# Patient Record
Sex: Female | Born: 1955 | Race: White | Hispanic: No | Marital: Married | Smoking: Never smoker
Health system: Southern US, Community
[De-identification: ages and names within clinical notes are randomized; demographics above are authoritative.]

## PROBLEM LIST (undated history)

## (undated) DIAGNOSIS — F32A Depression, unspecified: Secondary | ICD-10-CM

## (undated) DIAGNOSIS — K769 Liver disease, unspecified: Secondary | ICD-10-CM

## (undated) DIAGNOSIS — K279 Peptic ulcer, site unspecified, unspecified as acute or chronic, without hemorrhage or perforation: Secondary | ICD-10-CM

## (undated) DIAGNOSIS — E079 Disorder of thyroid, unspecified: Secondary | ICD-10-CM

## (undated) DIAGNOSIS — K319 Disease of stomach and duodenum, unspecified: Secondary | ICD-10-CM

## (undated) DIAGNOSIS — K5790 Diverticulosis of intestine, part unspecified, without perforation or abscess without bleeding: Secondary | ICD-10-CM

## (undated) DIAGNOSIS — Z1371 Encounter for nonprocreative screening for genetic disease carrier status: Secondary | ICD-10-CM

## (undated) DIAGNOSIS — T8859XA Other complications of anesthesia, initial encounter: Secondary | ICD-10-CM

## (undated) DIAGNOSIS — F329 Major depressive disorder, single episode, unspecified: Secondary | ICD-10-CM

## (undated) DIAGNOSIS — K219 Gastro-esophageal reflux disease without esophagitis: Secondary | ICD-10-CM

## (undated) DIAGNOSIS — M1990 Unspecified osteoarthritis, unspecified site: Secondary | ICD-10-CM

## (undated) DIAGNOSIS — K760 Fatty (change of) liver, not elsewhere classified: Secondary | ICD-10-CM

## (undated) DIAGNOSIS — K635 Polyp of colon: Secondary | ICD-10-CM

## (undated) HISTORY — DX: Disease of stomach and duodenum, unspecified: K31.9

## (undated) HISTORY — DX: Encounter for nonprocreative screening for genetic disease carrier status: Z13.71

## (undated) HISTORY — DX: Gastro-esophageal reflux disease without esophagitis: K21.9

## (undated) HISTORY — PX: BREAST SURGERY: SHX581

## (undated) HISTORY — PX: TUBAL LIGATION: SHX77

## (undated) HISTORY — DX: Polyp of colon: K63.5

## (undated) HISTORY — DX: Major depressive disorder, single episode, unspecified: F32.9

## (undated) HISTORY — DX: Liver disease, unspecified: K76.9

## (undated) HISTORY — PX: UTERINE FIBROID SURGERY: SHX826

## (undated) HISTORY — PX: HYSTEROSCOPY: SHX211

## (undated) HISTORY — DX: Disorder of thyroid, unspecified: E07.9

## (undated) HISTORY — DX: Unspecified osteoarthritis, unspecified site: M19.90

## (undated) HISTORY — PX: INCONTINENCE SURGERY: SHX676

## (undated) HISTORY — DX: Peptic ulcer, site unspecified, unspecified as acute or chronic, without hemorrhage or perforation: K27.9

## (undated) HISTORY — DX: Depression, unspecified: F32.A

## (undated) HISTORY — DX: Diverticulosis of intestine, part unspecified, without perforation or abscess without bleeding: K57.90

---

## 1998-01-09 HISTORY — PX: BREAST EXCISIONAL BIOPSY: SUR124

## 2003-11-02 ENCOUNTER — Ambulatory Visit: Payer: Self-pay | Admitting: Unknown Physician Specialty

## 2004-12-05 ENCOUNTER — Ambulatory Visit: Payer: Self-pay | Admitting: Obstetrics & Gynecology

## 2005-12-20 ENCOUNTER — Ambulatory Visit: Payer: Self-pay | Admitting: Obstetrics & Gynecology

## 2005-12-28 ENCOUNTER — Ambulatory Visit: Payer: Self-pay | Admitting: Gastroenterology

## 2006-12-25 ENCOUNTER — Ambulatory Visit: Payer: Self-pay | Admitting: Obstetrics & Gynecology

## 2008-01-07 ENCOUNTER — Ambulatory Visit: Payer: Self-pay | Admitting: Obstetrics & Gynecology

## 2009-01-21 ENCOUNTER — Ambulatory Visit: Payer: Self-pay | Admitting: Obstetrics & Gynecology

## 2009-07-08 ENCOUNTER — Ambulatory Visit: Payer: Self-pay | Admitting: Unknown Physician Specialty

## 2009-07-28 ENCOUNTER — Ambulatory Visit: Payer: Self-pay | Admitting: Unknown Physician Specialty

## 2009-07-28 LAB — HM COLONOSCOPY

## 2009-07-30 LAB — PATHOLOGY REPORT

## 2010-01-13 ENCOUNTER — Ambulatory Visit: Payer: Self-pay | Admitting: Obstetrics & Gynecology

## 2010-01-14 ENCOUNTER — Ambulatory Visit: Payer: Self-pay | Admitting: Obstetrics & Gynecology

## 2010-01-18 LAB — PATHOLOGY REPORT

## 2010-02-09 ENCOUNTER — Ambulatory Visit: Payer: Self-pay | Admitting: Obstetrics & Gynecology

## 2013-12-10 LAB — CBC AND DIFFERENTIAL
HCT: 42 % (ref 36–46)
HEMOGLOBIN: 13.8 g/dL (ref 12.0–16.0)
Neutrophils Absolute: 63 /uL
Platelets: 295 10*3/uL (ref 150–399)
WBC: 9.4 10*3/mL

## 2013-12-10 LAB — LIPID PANEL
CHOLESTEROL: 184 mg/dL (ref 0–200)
HDL: 52 mg/dL (ref 35–70)
LDL CALC: 106 mg/dL
LDL/HDL RATIO: 2
TRIGLYCERIDES: 129 mg/dL (ref 40–160)

## 2013-12-10 LAB — BASIC METABOLIC PANEL
BUN: 14 mg/dL (ref 4–21)
CREATININE: 0.7 mg/dL (ref <=1.1)
Glucose: 88 mg/dL
Potassium: 4.2 mmol/L (ref 3.4–5.3)
SODIUM: 142 mmol/L (ref 137–147)

## 2013-12-10 LAB — TSH: TSH: 3.59 u[IU]/mL (ref <=5.90)

## 2013-12-10 LAB — HEPATIC FUNCTION PANEL
ALT: 14 U/L (ref 7–35)
AST: 18 U/L (ref 13–35)
Alkaline Phosphatase: 109 U/L (ref 25–125)
Bilirubin, Total: 0.4 mg/dL

## 2014-01-19 ENCOUNTER — Ambulatory Visit: Payer: Self-pay | Admitting: Obstetrics & Gynecology

## 2014-05-13 DIAGNOSIS — K635 Polyp of colon: Secondary | ICD-10-CM | POA: Insufficient documentation

## 2014-05-13 DIAGNOSIS — K5790 Diverticulosis of intestine, part unspecified, without perforation or abscess without bleeding: Secondary | ICD-10-CM | POA: Insufficient documentation

## 2014-05-13 DIAGNOSIS — K279 Peptic ulcer, site unspecified, unspecified as acute or chronic, without hemorrhage or perforation: Secondary | ICD-10-CM | POA: Insufficient documentation

## 2014-05-13 DIAGNOSIS — E669 Obesity, unspecified: Secondary | ICD-10-CM | POA: Insufficient documentation

## 2014-06-25 ENCOUNTER — Ambulatory Visit (INDEPENDENT_AMBULATORY_CARE_PROVIDER_SITE_OTHER): Payer: Commercial Managed Care - PPO | Admitting: Family Medicine

## 2014-06-25 ENCOUNTER — Encounter: Payer: Self-pay | Admitting: Family Medicine

## 2014-06-25 VITALS — BP 124/70 | HR 72 | Temp 98.7°F | Resp 14 | Ht 64.0 in | Wt 199.0 lb

## 2014-06-25 DIAGNOSIS — M25511 Pain in right shoulder: Secondary | ICD-10-CM | POA: Diagnosis not present

## 2014-06-25 DIAGNOSIS — K2100 Gastro-esophageal reflux disease with esophagitis, without bleeding: Secondary | ICD-10-CM

## 2014-06-25 DIAGNOSIS — I868 Varicose veins of other specified sites: Secondary | ICD-10-CM

## 2014-06-25 DIAGNOSIS — I8390 Asymptomatic varicose veins of unspecified lower extremity: Secondary | ICD-10-CM

## 2014-06-25 MED ORDER — RANITIDINE HCL 150 MG PO TABS: 150.0000 mg | ORAL_TABLET | Freq: Two times a day (BID) | ORAL | Status: DC
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
## 2014-10-21 ENCOUNTER — Encounter: Payer: Self-pay | Admitting: Family Medicine

## 2014-10-21 ENCOUNTER — Ambulatory Visit (INDEPENDENT_AMBULATORY_CARE_PROVIDER_SITE_OTHER): Payer: Commercial Managed Care - PPO | Admitting: Family Medicine

## 2014-10-21 VITALS — BP 118/62 | HR 68 | Temp 98.3°F | Resp 16 | Wt 205.0 lb

## 2014-10-21 DIAGNOSIS — K219 Gastro-esophageal reflux disease without esophagitis: Secondary | ICD-10-CM | POA: Diagnosis not present

## 2014-10-21 NOTE — Progress Notes (Signed)
Patient ID: Danielle Davenport, female   DOB: [birth year], 59 y.o.   MRN: [id number]    Subjective:  HPI  GERD follow up: Patient  Is here for 4 months follow up. Zantac was added last time and patient has been taking this in the morning and still taking Omeprazole in the evening. She states symptoms have improve. She is use to have gas and stomach pains daily and now it happens about 3 to 4 times week.  Prior to Admission medications   Medication Sig Start Date End Date Taking? Authorizing Provider  acetaminophen (TYLENOL) 325 MG tablet Take by mouth.   Yes Historical Provider, MD  ibuprofen (ADVIL,MOTRIN) 800 MG tablet Take by mouth.   Yes Historical Provider, MD  Multiple Vitamins-Minerals (CENTRUM SILVER) CHEW Chew by mouth.   Yes Historical Provider, MD  naproxen (NAPROSYN) 500 MG tablet Take by mouth. 12/09/13  Yes Historical Provider, MD  Omega-3 Fatty Acids (FISH OIL) 1000 MG CAPS Take by mouth.   Yes Historical Provider, MD  Omeprazole 20 MG TBEC Take by mouth. 12/09/13  Yes Historical Provider, MD  ranitidine (ZANTAC) 150 MG tablet Take 1 tablet (150 mg total) by mouth 2 (two) times daily. 06/25/14  Yes Richard Maceo Pro., MD    Patient Active Problem List   Diagnosis Date Noted  . Colon polyp 05/13/2014  . Adiposity 05/13/2014  . Gastroduodenal ulcer 05/13/2014  . DD (diverticular disease) 05/13/2014    Past Medical History  Diagnosis Date  . GERD (gastroesophageal reflux disease)   . Gastroduodenal ulcer   . Colon polyp   . Diverticulosis     Social History   Social History  . Marital Status: Married    Spouse Name: N/A  . Number of Children: N/A  . Years of Education: N/A   Occupational History  . Not on file.   Social History Main Topics  . Smoking status: Never Smoker   . Smokeless tobacco: Never Used  . Alcohol Use: 0.0 oz/week    0 Standard drinks or equivalent per week     Comment: 1 beer month  . Drug Use: No  . Sexual Activity: Not on  file   Other Topics Concern  . Not on file   Social History Narrative    No Known Allergies  Review of Systems  Constitutional: Negative.   HENT: Positive for congestion and sore throat.   Respiratory: Negative.   Cardiovascular: Negative.   Gastrointestinal: Positive for abdominal pain.  Musculoskeletal: Positive for joint pain (shoulder pain better).  Neurological: Negative.   Endo/Heme/Allergies: Negative.   Psychiatric/Behavioral: Negative.     Immunization History  Administered Date(s) Administered  . Tdap 12/09/2013   Objective:  BP 118/62 mmHg  Pulse 68  Temp(Src) 98.3 F (36.8 C)  Resp 16  Wt 205 lb (92.987 kg)  Physical Exam  Constitutional: She is oriented to person, place, and time and well-developed, well-nourished, and in no distress.  HENT:  Head: Normocephalic and atraumatic.  Right Ear: External ear normal.  Left Ear: External ear normal.  Nose: Nose normal.  Eyes: Conjunctivae are normal.  Neck: Neck supple.  Cardiovascular: Normal rate, regular rhythm and normal heart sounds.   Pulmonary/Chest: Effort normal.  Abdominal: Soft.  Neurological: She is alert and oriented to person, place, and time.  Skin: Skin is warm and dry.  Psychiatric: Mood, memory, affect and judgment normal.    Lab Results  Component Value Date   WBC 9.4 12/10/2013   HGB  13.8 12/10/2013   HCT 42 12/10/2013   PLT 295 12/10/2013   CHOL 184 12/10/2013   TRIG 129 12/10/2013   HDL 52 12/10/2013   LDLCALC 106 12/10/2013   TSH 3.59 12/10/2013    CMP     Component Value Date/Time   NA 142 12/10/2013   K 4.2 12/10/2013   BUN 14 12/10/2013   CREATININE 0.7 12/10/2013   AST 18 12/10/2013   ALT 14 12/10/2013   ALKPHOS 109 12/10/2013    Assessment and Plan :  GERD Improving. Obesity I have done the exam and reviewed the above chart and it is accurate to the best of my knowledge.  I have done the exam and reviewed the above chart and it is accurate to the best  of my knowledge.    Miguel Aschoff MD [facility] 10/21/2014 3:04 PM

## 2015-06-28 ENCOUNTER — Encounter: Payer: Self-pay | Admitting: Family Medicine

## 2015-06-28 ENCOUNTER — Ambulatory Visit (INDEPENDENT_AMBULATORY_CARE_PROVIDER_SITE_OTHER): Payer: Commercial Managed Care - PPO | Admitting: Family Medicine

## 2015-06-28 VITALS — BP 118/74 | HR 62 | Temp 98.2°F | Resp 16 | Ht 63.5 in | Wt 203.0 lb

## 2015-06-28 DIAGNOSIS — E669 Obesity, unspecified: Secondary | ICD-10-CM

## 2015-06-28 DIAGNOSIS — E785 Hyperlipidemia, unspecified: Secondary | ICD-10-CM | POA: Diagnosis not present

## 2015-06-28 DIAGNOSIS — Z0000 Encounter for general adult medical examination without abnormal findings: Secondary | ICD-10-CM

## 2015-06-28 MED ORDER — ALPRAZOLAM 0.25 MG PO TABS: 0.2500 mg | ORAL_TABLET | Freq: Every evening | ORAL | Status: DC | PRN

## 2015-06-28 NOTE — Progress Notes (Signed)
Patient ID: Danielle Davenport, female   DOB: 01-21-1955, 60 y.o.   MRN: [id number]  Visit Date: 06/28/2015  Today's Provider: Wilhemena Durie, MD   Chief Complaint  Patient presents with  . Annual Exam   Subjective:  Danielle Davenport is a 60 y.o. female who presents today for health maintenance and complete physical. She feels well. She reports exercising just oding yard work and stays active at work. She reports she is sleeping well.  Last Tdap 12/09/13  Colonoscopy 07/28/09  Mammogram 2015 through [facility]  Pap smear 10/2013 per patient with Dr. Laverle Patter at [facility].  Review of Systems  Constitutional: Negative.   HENT: Negative.   Eyes: Positive for visual disturbance.  Respiratory: Negative.   Cardiovascular: Negative.   Gastrointestinal: Positive for abdominal pain.  Endocrine: Negative.   Genitourinary: Negative.   Musculoskeletal: Positive for arthralgias.  Skin: Negative.   Allergic/Immunologic: Negative.   Neurological: Negative.   Hematological: Negative.   Psychiatric/Behavioral: The patient is nervous/anxious.     Social History   Social History  . Marital Status: Married    Spouse Name: N/A  . Number of Children: N/A  . Years of Education: N/A   Occupational History  . Not on file.   Social History Main Topics  . Smoking status: Never Smoker   . Smokeless tobacco: Never Used  . Alcohol Use: 0.0 oz/week    0 Standard drinks or equivalent per week     Comment: 1 beer month  . Drug Use: No  . Sexual Activity: Not on file   Other Topics Concern  . Not on file   Social History Narrative    Patient Active Problem List   Diagnosis Date Noted  . Colon polyp 05/13/2014  . Adiposity 05/13/2014  . Gastroduodenal ulcer 05/13/2014  . DD (diverticular disease) 05/13/2014    Past Surgical History  Procedure Laterality Date  . Tubal ligation    . Incontinence surgery    . Uterine fibroid surgery      Her family history includes  Breast cancer in her mother; COPD in her brother and daughter; Cirrhosis in her father; Emphysema in her maternal grandfather; Stroke in her paternal grandfather.    Outpatient Prescriptions Prior to Visit  Medication Sig Dispense Refill  . acetaminophen (TYLENOL) 325 MG tablet Take by mouth.    Marland Kitchen ibuprofen (ADVIL,MOTRIN) 800 MG tablet Take by mouth.    . Multiple Vitamins-Minerals (CENTRUM SILVER) CHEW Chew by mouth.    . naproxen (NAPROSYN) 500 MG tablet Take by mouth.    . Omega-3 Fatty Acids (FISH OIL) 1000 MG CAPS Take by mouth.    . ranitidine (ZANTAC) 150 MG tablet Take 1 tablet (150 mg total) by mouth 2 (two) times daily. 60 tablet 12  . Omeprazole 20 MG TBEC Take by mouth.     No facility-administered medications prior to visit.    Patient Care Team: Jerrol Banana., MD as PCP - General (Family Medicine)     Objective:   Vitals:  Filed Vitals:   06/28/15 0912  BP: 118/74  Pulse: 62  Temp: 98.2 F (36.8 C)  Resp: 16  Height: 5' 3.5" (1.613 m)  Weight: 203 lb (92.08 kg)    Physical Exam  Constitutional: She is oriented to person, place, and time. She appears well-developed and well-nourished.  HENT:  Head: Normocephalic and atraumatic.  Right Ear: External ear normal.  Left Ear: External ear normal.  Eyes: Conjunctivae are  normal. Pupils are equal, round, and reactive to light.  Neck: Normal range of motion. Neck supple.  Cardiovascular: Normal rate, regular rhythm, normal heart sounds and intact distal pulses.   No murmur heard. Right varicose veins below the knee.  Pulmonary/Chest: Effort normal and breath sounds normal. No respiratory distress.  Abdominal: Soft. She exhibits no distension. There is no tenderness.  Musculoskeletal: She exhibits no edema or tenderness.  Neurological: She is alert and oriented to person, place, and time. No cranial nerve deficit.  Skin: No rash noted. No erythema.  Psychiatric: She has a normal mood and affect. Her  behavior is normal. Judgment and thought content normal.     Depression Screen PHQ 2/9 Scores 06/28/2015 10/21/2014  PHQ - 2 Score 0 0      Assessment & Plan:    1. Annual physical exam Check routine labs, she is going to follow up with Dr. Kenton Kingfisher for pap smear and mammogram. - CBC with Differential/Platelet - Comprehensive metabolic panel - Lipid Panel With LDL/HDL Ratio - TSH GYN care through [place] [facility]. 2. Adiposity - Lipid Panel With LDL/HDL Ratio - TSH  3. Hyperlipidemia - Comprehensive metabolic panel - Lipid Panel With LDL/HDL Ratio 4.GAD Husband is in failing health. Given her alprazolam 0.25 mg daily when necessary for chronic anxiety. 5. Varicose veins  Patient was seen and examined by Dr. Eulas Post and note was scribed by Theressa Millard, RMA.

## 2015-06-29 LAB — COMPREHENSIVE METABOLIC PANEL
A/G RATIO: 1.8 (ref 1.2–2.2)
ALT: 19 IU/L (ref 0–32)
AST: 17 IU/L (ref 0–40)
Albumin: 4.7 g/dL (ref 3.6–4.8)
Alkaline Phosphatase: 115 IU/L (ref 39–117)
BUN / CREAT RATIO: 20 (ref 12–28)
BUN: 15 mg/dL (ref 8–27)
Bilirubin Total: 0.4 mg/dL (ref 0.0–1.2)
CALCIUM: 9.4 mg/dL (ref 8.7–10.3)
CO2: 23 mmol/L (ref 18–29)
CREATININE: 0.76 mg/dL (ref 0.57–1.00)
Chloride: 103 mmol/L (ref 96–106)
GFR calc Af Amer: 99 mL/min/{1.73_m2} (ref >59)
GFR, EST NON AFRICAN AMERICAN: 86 mL/min/{1.73_m2} (ref >59)
GLOBULIN, TOTAL: 2.6 g/dL (ref 1.5–4.5)
Glucose: 89 mg/dL (ref 65–99)
POTASSIUM: 4.5 mmol/L (ref 3.5–5.2)
SODIUM: 144 mmol/L (ref 134–144)
TOTAL PROTEIN: 7.3 g/dL (ref 6.0–8.5)

## 2015-06-29 LAB — CBC WITH DIFFERENTIAL/PLATELET
BASOS: 1 %
Basophils Absolute: 0 10*3/uL (ref 0.0–0.2)
EOS (ABSOLUTE): 0.2 10*3/uL (ref 0.0–0.4)
EOS: 3 %
HEMATOCRIT: 38.8 % (ref 34.0–46.6)
Hemoglobin: 13 g/dL (ref 11.1–15.9)
Immature Grans (Abs): 0 10*3/uL (ref 0.0–0.1)
Immature Granulocytes: 0 %
Lymphocytes Absolute: 2.9 10*3/uL (ref 0.7–3.1)
Lymphs: 35 %
MCH: 28.3 pg (ref 26.6–33.0)
MCHC: 33.5 g/dL (ref 31.5–35.7)
MCV: 85 fL (ref 79–97)
MONOS ABS: 0.4 10*3/uL (ref 0.1–0.9)
Monocytes: 5 %
NEUTROS PCT: 56 %
Neutrophils Absolute: 4.6 10*3/uL (ref 1.4–7.0)
Platelets: 293 10*3/uL (ref 150–379)
RBC: 4.59 x10E6/uL (ref 3.77–5.28)
RDW: 13.7 % (ref 12.3–15.4)
WBC: 8.1 10*3/uL (ref 3.4–10.8)

## 2015-06-29 LAB — LIPID PANEL WITH LDL/HDL RATIO
Cholesterol, Total: 217 mg/dL — ABNORMAL HIGH (ref 100–199)
HDL: 62 mg/dL (ref >39)
LDL CALC: 130 mg/dL — AB (ref 0–99)
LDL/HDL RATIO: 2.1 ratio (ref 0.0–3.2)
TRIGLYCERIDES: 123 mg/dL (ref 0–149)
VLDL Cholesterol Cal: 25 mg/dL (ref 5–40)

## 2015-06-29 LAB — TSH: TSH: 4.65 u[IU]/mL — AB (ref 0.450–4.500)

## 2015-09-01 LAB — HM PAP SMEAR: HM PAP: NEGATIVE

## 2015-09-24 ENCOUNTER — Inpatient Hospital Stay
Admission: RE | Admit: 2015-09-24 | Discharge: 2015-09-24 | Disposition: A | Discharge status: Home / Self Care | Payer: Self-pay | Source: Ambulatory Visit | Attending: *Deleted | Admitting: *Deleted

## 2015-09-24 ENCOUNTER — Other Ambulatory Visit: Payer: Self-pay | Admitting: Obstetrics & Gynecology

## 2015-09-24 ENCOUNTER — Other Ambulatory Visit: Payer: Self-pay | Admitting: *Deleted

## 2015-09-24 DIAGNOSIS — Z9289 Personal history of other medical treatment: Secondary | ICD-10-CM

## 2015-09-24 DIAGNOSIS — R921 Mammographic calcification found on diagnostic imaging of breast: Secondary | ICD-10-CM

## 2015-09-24 DIAGNOSIS — Z1231 Encounter for screening mammogram for malignant neoplasm of breast: Secondary | ICD-10-CM

## 2015-09-28 ENCOUNTER — Inpatient Hospital Stay
Admission: RE | Admit: 2015-09-28 | Discharge: 2015-09-28 | Disposition: A | Discharge status: Home / Self Care | Payer: Self-pay | Source: Ambulatory Visit | Attending: *Deleted | Admitting: *Deleted

## 2015-09-28 ENCOUNTER — Other Ambulatory Visit: Payer: Self-pay | Admitting: *Deleted

## 2015-09-28 DIAGNOSIS — Z9289 Personal history of other medical treatment: Secondary | ICD-10-CM

## 2015-10-06 ENCOUNTER — Encounter: Payer: Self-pay | Admitting: Family Medicine

## 2015-10-06 ENCOUNTER — Ambulatory Visit (INDEPENDENT_AMBULATORY_CARE_PROVIDER_SITE_OTHER): Payer: Commercial Managed Care - PPO | Admitting: Family Medicine

## 2015-10-06 VITALS — BP 120/72 | HR 84 | Temp 98.4°F | Resp 16 | Wt 200.0 lb

## 2015-10-06 DIAGNOSIS — R7989 Other specified abnormal findings of blood chemistry: Secondary | ICD-10-CM | POA: Diagnosis not present

## 2015-10-06 DIAGNOSIS — F329 Major depressive disorder, single episode, unspecified: Secondary | ICD-10-CM

## 2015-10-06 DIAGNOSIS — E7800 Pure hypercholesterolemia, unspecified: Secondary | ICD-10-CM | POA: Diagnosis not present

## 2015-10-06 DIAGNOSIS — F32A Depression, unspecified: Secondary | ICD-10-CM

## 2015-10-06 DIAGNOSIS — R002 Palpitations: Secondary | ICD-10-CM | POA: Diagnosis not present

## 2015-10-06 DIAGNOSIS — F419 Anxiety disorder, unspecified: Secondary | ICD-10-CM | POA: Diagnosis not present

## 2015-10-06 NOTE — Progress Notes (Signed)
Patient: Danielle Davenport Female    DOB: 09/22/1955   60 y.o.   MRN: [id number] Visit Date: 10/06/2015  Today's Provider: Wilhemena Durie, MD   Chief Complaint  Patient presents with  . Abnormal Lab   Subjective:    HPI   Abnormal Labs Pt is here to FU on abnormal labs from June 2017. Pt's TSH was 4.650. Pt c/o dry skin, dry hair, fatigue, some stool changes, and palpitations. Pt's total cholesterol was 217, and her LDL was 130 at that time.  No Known Allergies   Current Outpatient Prescriptions:  .  acetaminophen (TYLENOL) 325 MG tablet, Take by mouth., Disp: , Rfl:  .  ALPRAZolam (XANAX) 0.25 MG tablet, Take 1 tablet (0.25 mg total) by mouth at bedtime as needed for anxiety., Disp: 30 tablet, Rfl: 5 .  ibuprofen (ADVIL,MOTRIN) 800 MG tablet, Take by mouth., Disp: , Rfl:  .  Multiple Vitamins-Minerals (CENTRUM SILVER) CHEW, Chew by mouth., Disp: , Rfl:  .  naproxen (NAPROSYN) 500 MG tablet, Take by mouth., Disp: , Rfl:  .  Omega-3 Fatty Acids (FISH OIL) 1000 MG CAPS, Take by mouth., Disp: , Rfl:  .  ranitidine (ZANTAC) 150 MG tablet, Take 1 tablet (150 mg total) by mouth 2 (two) times daily., Disp: 60 tablet, Rfl: 12 .  vitamin E (VITAMIN E) 400 UNIT capsule, Take 400 Units by mouth daily., Disp: , Rfl:   Review of Systems  Constitutional: Positive for fatigue and unexpected weight change. Negative for activity change, appetite change, chills, diaphoresis and fever.  HENT: Negative.   Eyes: Negative.   Respiratory: Negative for cough and shortness of breath.   Cardiovascular: Positive for palpitations. Negative for chest pain and leg swelling.  Endocrine: Negative for cold intolerance and heat intolerance.  Allergic/Immunologic: Negative.   Hematological: Negative.   Psychiatric/Behavioral: Negative.     Social History  Substance Use Topics  . Smoking status: Never Smoker  . Smokeless tobacco: Never Used  . Alcohol use 0.0 oz/week     Comment: 1 beer  month   Objective:   BP 120/72 (BP Location: Right Arm, Patient Position: Sitting, Cuff Size: Large)   Pulse 84   Temp 98.4 F (36.9 C) (Oral)   Resp 16   Wt 200 lb (90.7 kg)   BMI 34.87 kg/m   Physical Exam  Constitutional: She is oriented to person, place, and time. She appears well-developed and well-nourished.  HENT:  Head: Normocephalic and atraumatic.  Right Ear: External ear normal.  Left Ear: External ear normal.  Nose: Nose normal.  Mouth/Throat: Oropharynx is clear and moist.  Eyes: Conjunctivae are normal.  Neck: Normal range of motion. Neck supple. No thyromegaly present.  Cardiovascular: Normal rate, regular rhythm and normal heart sounds.   Pulmonary/Chest: Effort normal and breath sounds normal. No respiratory distress.  Abdominal: Soft.  Musculoskeletal: Normal range of motion. She exhibits no edema.  Neurological: She is alert and oriented to person, place, and time.  Skin: Skin is warm and dry.  Hair thinning  Psychiatric: She has a normal mood and affect. Her behavior is normal. Judgment and thought content normal.        Assessment & Plan:     1. Palpitations  - EKG 12-Lead  2. Abnormal TSH  - TSH  3. Hypercholesterolemia   4. Acute anxiety   5. Depression Worsening. Declines antidepressant at this time. Will follow thyroid for now, which may improve sx. Will FU  2 months, and perform PHQ-9 at that time.  Depression screen PHQ 2/9 10/06/2015  Decreased Interest 2  Down, Depressed, Hopeless 1  PHQ - 2 Score 3  Altered sleeping 3  Tired, decreased energy 3  Change in appetite 1  Feeling bad or failure about yourself  1  Trouble concentrating 1  Moving slowly or fidgety/restless 1  Suicidal thoughts 0  PHQ-9 Score 13      Patient seen and examined by Miguel Aschoff, MD, and note scribed by Renaldo Fiddler, CMA.  I have done the exam and reviewed the above chart and it is accurate to the best of my knowledge.  England Greb Cranford Mon,  MD  [facility]

## 2015-10-07 LAB — TSH: TSH: 3.91 u[IU]/mL (ref 0.450–4.500)

## 2015-10-08 ENCOUNTER — Telehealth: Payer: Self-pay

## 2015-10-08 NOTE — Telephone Encounter (Signed)
-----   Message from Jerrol Banana., MD sent at 10/07/2015  1:48 PM EDT ----- Thyroid better. Can follow or concerns start Synthroid 25 g. Patient should have appointment in 2 months anyway. Either way is okay.

## 2015-10-08 NOTE — Telephone Encounter (Signed)
Patient has been advised she states that she will follow up in office in 2 months. KW

## 2015-10-14 ENCOUNTER — Ambulatory Visit: Payer: Self-pay

## 2015-10-14 ENCOUNTER — Ambulatory Visit
Admission: RE | Admit: 2015-10-14 | Discharge: 2015-10-14 | Disposition: A | Discharge status: Home / Self Care | Payer: Commercial Managed Care - PPO | Source: Ambulatory Visit | Attending: Obstetrics & Gynecology | Admitting: Obstetrics & Gynecology

## 2015-10-14 ENCOUNTER — Other Ambulatory Visit: Payer: Self-pay

## 2015-10-14 DIAGNOSIS — Z1231 Encounter for screening mammogram for malignant neoplasm of breast: Secondary | ICD-10-CM

## 2015-10-14 DIAGNOSIS — R921 Mammographic calcification found on diagnostic imaging of breast: Secondary | ICD-10-CM

## 2015-12-08 ENCOUNTER — Ambulatory Visit (INDEPENDENT_AMBULATORY_CARE_PROVIDER_SITE_OTHER): Payer: Commercial Managed Care - PPO | Admitting: Family Medicine

## 2015-12-08 VITALS — BP 126/62 | HR 74 | Temp 98.9°F | Resp 14 | Wt 202.0 lb

## 2015-12-08 DIAGNOSIS — R946 Abnormal results of thyroid function studies: Secondary | ICD-10-CM | POA: Diagnosis not present

## 2015-12-08 DIAGNOSIS — R002 Palpitations: Secondary | ICD-10-CM

## 2015-12-08 DIAGNOSIS — F3289 Other specified depressive episodes: Secondary | ICD-10-CM | POA: Diagnosis not present

## 2015-12-08 DIAGNOSIS — F419 Anxiety disorder, unspecified: Secondary | ICD-10-CM

## 2015-12-08 DIAGNOSIS — R7989 Other specified abnormal findings of blood chemistry: Secondary | ICD-10-CM

## 2015-12-08 NOTE — Progress Notes (Signed)
Danielle Davenport Danielle Davenport  MRN: [id number] DOB: [birth year]  Subjective:  HPI  Patient is here for 2 months follow up on depression, palpitations and thyroid level. Labs were done last time and thyroid was better-normal at that time  Patient declined starting antidepressant last time. Patient states she maybe feeling a little better emotionally then 2 months ago.  Palpitations are not as frequent. EKG was done last time. Depression screen [facility] 2/9 12/08/2015 10/06/2015 06/28/2015  Decreased Interest 1 2 0  Down, Depressed, Hopeless 1 1 0  PHQ - 2 Score 2 3 0  Altered sleeping 2 3 -  Tired, decreased energy 1 3 -  Change in appetite 1 1 -  Feeling bad or failure about yourself  0 1 -  Trouble concentrating 0 1 -  Moving slowly or fidgety/restless 1 1 -  Suicidal thoughts 0 0 -  PHQ-9 Score 7 13 -  Difficult doing work/chores Somewhat difficult - -   Patient Active Problem List   Diagnosis Date Noted  . Colon polyp 05/13/2014  . Adiposity 05/13/2014  . Gastroduodenal ulcer 05/13/2014  . DD (diverticular disease) 05/13/2014    Past Medical History:  Diagnosis Date  . Colon polyp   . Diverticulosis   . Gastroduodenal ulcer   . GERD (gastroesophageal reflux disease)     Social History   Social History  . Marital status: Married    Spouse name: N/A  . Number of children: N/A  . Years of education: N/A   Occupational History  . Not on file.   Social History Main Topics  . Smoking status: Never Smoker  . Smokeless tobacco: Never Used  . Alcohol use 0.0 oz/week     Comment: 1 beer month  . Drug use: No  . Sexual activity: Not on file   Other Topics Concern  . Not on file   Social History Narrative  . No narrative on file    Outpatient Encounter Prescriptions as of 12/08/2015  Medication Sig Note  . acetaminophen (TYLENOL) 325 MG tablet Take by mouth. 05/13/2014: Medication taken as needed.  Received from: [facility]  . ALPRAZolam (XANAX)  0.25 MG tablet Take 1 tablet (0.25 mg total) by mouth at bedtime as needed for anxiety.   Marland Kitchen ibuprofen (ADVIL,MOTRIN) 800 MG tablet Take by mouth. 05/13/2014: Received from: [facility]  . Multiple Vitamins-Minerals (CENTRUM SILVER) CHEW Chew by mouth. 05/13/2014: Received from: [facility]  . naproxen (NAPROSYN) 500 MG tablet Take by mouth. 05/13/2014: Medication taken as needed.  Received from: [facility]  . Omega-3 Fatty Acids (FISH OIL) 1000 MG CAPS Take by mouth. 05/13/2014: Received from: [facility]  . ranitidine (ZANTAC) 150 MG tablet Take 1 tablet (150 mg total) by mouth 2 (two) times daily.   . vitamin E (VITAMIN E) 400 UNIT capsule Take 400 Units by mouth daily.    No facility-administered encounter medications on file as of 12/08/2015.     No Known Allergies  Review of Systems  Constitutional: Positive for malaise/fatigue.  Respiratory: Negative.   Cardiovascular: Positive for palpitations.  Musculoskeletal: Negative.   Psychiatric/Behavioral: Positive for depression. The patient is nervous/anxious.     Objective:  BP 126/62   Pulse 74   Temp 98.9 F (37.2 C)   Resp 14   Wt 202 lb (91.6 kg)   BMI 35.22 kg/m   Physical Exam  Constitutional: She is oriented to person, place, and time and well-developed, well-nourished, and in  no distress.  HENT:  Head: Normocephalic and atraumatic.  Right Ear: External ear normal.  Left Ear: External ear normal.  Nose: Nose normal.  Eyes: Conjunctivae are normal. Pupils are equal, round, and reactive to light. No scleral icterus.  Neck: No thyromegaly present.  Cardiovascular: Normal rate, regular rhythm, normal heart sounds and intact distal pulses.   No murmur heard. Pulmonary/Chest: Effort normal and breath sounds normal. No respiratory distress. She has no wheezes.  Abdominal: Soft.  Musculoskeletal: She exhibits no edema or tenderness.  Neurological: She is  alert and oriented to person, place, and time. Gait normal.  Skin: Skin is warm and dry.  Psychiatric: Mood, memory, affect and judgment normal.    Assessment and Plan :  1. Other depression Some better. PHQ9 score today is 7 and it was 13. She has a lot going on at home and in her life that she has to deal with.Her first husband died at age 2 and then she remarried and her second husband is now disabled and in poor health. Discussed with patient seeking counseling through work. Will re access in 6 months or sooner if needed. 2. Acute anxiety  3. Palpitations Better. Follow as needed.  Thyroid level is better at this time/normal. Follow.  HPI, Exam and A&P transcribed under direction and in the presence of Miguel Aschoff, MD. I have done the exam and reviewed the chart and it is accurate to the best of my knowledge. Development worker, community has been used and  any errors in dictation or transcription are unintentional. Miguel Aschoff M.D. [facility]

## 2016-02-04 ENCOUNTER — Other Ambulatory Visit: Payer: Self-pay | Admitting: Family Medicine

## 2016-04-03 ENCOUNTER — Encounter (INDEPENDENT_AMBULATORY_CARE_PROVIDER_SITE_OTHER): Payer: Self-pay | Admitting: Vascular Surgery

## 2016-04-03 ENCOUNTER — Ambulatory Visit (INDEPENDENT_AMBULATORY_CARE_PROVIDER_SITE_OTHER): Payer: Commercial Managed Care - PPO | Admitting: Vascular Surgery

## 2016-04-03 DIAGNOSIS — I890 Lymphedema, not elsewhere classified: Secondary | ICD-10-CM | POA: Insufficient documentation

## 2016-04-03 DIAGNOSIS — M79605 Pain in left leg: Secondary | ICD-10-CM

## 2016-04-03 DIAGNOSIS — I739 Peripheral vascular disease, unspecified: Secondary | ICD-10-CM

## 2016-04-03 DIAGNOSIS — I872 Venous insufficiency (chronic) (peripheral): Secondary | ICD-10-CM | POA: Diagnosis not present

## 2016-04-03 DIAGNOSIS — M79604 Pain in right leg: Secondary | ICD-10-CM | POA: Diagnosis not present

## 2016-04-03 DIAGNOSIS — M79606 Pain in leg, unspecified: Secondary | ICD-10-CM | POA: Insufficient documentation

## 2016-04-03 NOTE — Progress Notes (Signed)
MRN : [id number]  Danielle Davenport is a 61 y.o. (01-18-1955) female who presents with chief complaint of  Chief Complaint  Patient presents with  . New Evaluation    Bilateral leg pain  .  History of Present Illness: The patient is seen for evaluation of painful lower extremities. Patient notes the pain is variable and not always associated with activity.  The pain is somewhat consistent day to day occurring on most days. The patient notes the pain also occurs with standing and routinely seems worse as the day wears on. The pain has been progressive over the past several years. The patient states these symptoms are causing  a profound negative impact on quality of life and daily activities.  She has a history of painful varicose veins and has had VNUS ablation of both right and left GSV in the past , back in 2007 No history of DVT or phlebitis.  The patient denies rest pain or dangling of an extremity off the side of the bed during the night for relief. No open wounds or sores at this time. No prior interventions or surgeries.  There is a  history of back problems and DJD of the lumbar and sacral spine.    Current Meds  Medication Sig  . acetaminophen (TYLENOL) 325 MG tablet Take by mouth.  . ALPRAZolam (XANAX) 0.25 MG tablet TAKE ONE TABLET BY MOUTH AT BEDTIME AS NEEDED FOR ANXIETY  . aspirin (ASPIRIN 81) 81 MG chewable tablet Chew by mouth daily.  Marland Kitchen ibuprofen (ADVIL,MOTRIN) 800 MG tablet Take by mouth.  . Multiple Vitamins-Minerals (CENTRUM SILVER) CHEW Chew by mouth.  . naproxen (NAPROSYN) 500 MG tablet Take by mouth.  . Omega-3 Fatty Acids (FISH OIL) 1000 MG CAPS Take by mouth.  . ranitidine (ZANTAC) 150 MG tablet Take 1 tablet (150 mg total) by mouth 2 (two) times daily.  . vitamin E (VITAMIN E) 400 UNIT capsule Take 400 Units by mouth daily.    Past Medical History:  Diagnosis Date  . Colon polyp   . Diverticulosis   . Gastroduodenal ulcer   . GERD  (gastroesophageal reflux disease)     Past Surgical History:  Procedure Laterality Date  . BREAST BIOPSY Left 2000  . INCONTINENCE SURGERY    . TUBAL LIGATION    . UTERINE FIBROID SURGERY      Social History Social History  Substance Use Topics  . Smoking status: Never Smoker  . Smokeless tobacco: Never Used  . Alcohol use 0.0 oz/week     Comment: 1 beer month    Family History Family History  Problem Relation Age of Onset  . Breast cancer Mother 50  . Cirrhosis Father   . COPD Brother   . COPD Daughter   . Emphysema Maternal Grandfather   . Stroke Paternal Grandfather   No family history of bleeding/clotting disorders, porphyria or autoimmune disease   No Known Allergies   REVIEW OF SYSTEMS (Negative unless checked)  Constitutional: [] Weight loss  [] Fever  [] Chills Cardiac: [] Chest pain   [] Chest pressure   [] Palpitations   [] Shortness of breath when laying flat   [] Shortness of breath with exertion. Vascular:  [x] Pain in legs with walking   [x] Pain in legs at rest  [] History of DVT   [] Phlebitis   [x] Swelling in legs   [x] Varicose veins   [] Non-healing ulcers Pulmonary:   [] Uses home oxygen   [] Productive cough   [] Hemoptysis   [] Wheeze  [] COPD   [] Asthma  Neurologic:  [] Dizziness   [] Seizures   [] History of stroke   [] History of TIA  [] Aphasia   [] Vissual changes   [] Weakness or numbness in arm   [] Weakness or numbness in leg Musculoskeletal:   [] Joint swelling   [] Joint pain   [] Low back pain Hematologic:  [] Easy bruising  [] Easy bleeding   [] Hypercoagulable state   [] Anemic Gastrointestinal:  [] Diarrhea   [] Vomiting  [] Gastroesophageal reflux/heartburn   [] Difficulty swallowing. Genitourinary:  [] Chronic kidney disease   [] Difficult urination  [] Frequent urination   [] Blood in urine Skin:  [] Rashes   [] Ulcers  Psychological:  [] History of anxiety   []  History of major depression.  Physical Examination  Vitals:   04/03/16 1056  BP: (!) 180/94  Pulse: 70    Resp: 16  Weight: 204 lb (92.5 kg)  Height: 5\' 4"  (1.626 m)   Body mass index is 35.02 kg/m. Gen: WD/WN, NAD Head: [place]/AT, No temporalis wasting.  Ear/Nose/Throat: Hearing grossly intact, nares w/o erythema or drainage, poor dentition Eyes: PER, EOMI, sclera nonicteric.  Neck: Supple, no masses.  No bruit or JVD.  Pulmonary:  Good air movement, clear to auscultation bilaterally, no use of accessory muscles.  Cardiac: RRR, normal S1, S2, no Murmurs. Vascular: 2-3+ edema bilaterally with moderate venous changes bilaterally, right > left.  No venous ulcers noted bilaterally, ankles noninfected Vessel Right Left  Radial Palpable Palpable  Ulnar Palpable Palpable  Brachial Palpable Palpable  Carotid Palpable Palpable  Femoral Palpable Palpable  Popliteal Not Palpable Not Palpable  PT Not Palpable Trace Palpable  DP Trace Palpable Not Palpable   Gastrointestinal: soft, non-distended. No guarding/no peritoneal signs.  Musculoskeletal: M/S 5/5 throughout.  No deformity or atrophy.  Neurologic: CN 2-12 intact. Pain and light touch intact in extremities.  Symmetrical.  Speech is fluent. Motor exam as listed above. Psychiatric: Judgment intact, Mood & affect appropriate for pt's clinical situation. Dermatologic: Venous stasis dermatitis with ulcers present.  No changes consistent with cellulitis. Lymph : No Cervical lymphadenopathy, no lichenification or skin changes of chronic lymphedema.  CBC Lab Results  Component Value Date   WBC 8.1 06/28/2015   HGB 13.8 12/10/2013   HCT 38.8 06/28/2015   MCV 85 06/28/2015   PLT 293 06/28/2015    BMET    Component Value Date/Time   NA 144 06/28/2015 1018   K 4.5 06/28/2015 1018   CL 103 06/28/2015 1018   CO2 23 06/28/2015 1018   GLUCOSE 89 06/28/2015 1018   BUN 15 06/28/2015 1018   CREATININE 0.76 06/28/2015 1018   CALCIUM 9.4 06/28/2015 1018   GFRNONAA 86 06/28/2015 1018   GFRAA 99 06/28/2015 1018   CrCl cannot be calculated  (Patient's most recent lab result is older than the maximum 21 days allowed.).  COAG No results found for: INR, PROTIME  Radiology No results found.  Assessment/Plan 1. Pain in both lower extremities  Recommend:  The patient has atypical pain symptoms for pure atherosclerotic disease. However, on physical exam there is evidence of mixed venous and arterial disease, given the diminished pulses and the edema associated with venous changes of the legs.  Noninvasive studies including ABI's and venous ultrasound of the legs will be obtained and the patient will follow up with me to review these studies.  The patient should continue walking and begin a more formal exercise program. The patient should continue his antiplatelet therapy and aggressive treatment of the lipid abnormalities.  The patient should begin wearing graduated compression socks 15-20 mmHg  strength to control edema.   2. Chronic venous insufficiency See #1 - VAS [place] LOWER EXTREMITY VENOUS REFLUX; Future  3. Lymphedema I have had a long discussion with the patient regarding swelling and why it  causes symptoms.  Patient will begin wearing graduated compression stockings class 1 (20-30 mmHg) on a daily basis a prescription was given. The patient will  beginning wearing the stockings first thing in the morning and removing them in the evening. The patient is instructed specifically not to sleep in the stockings.   In addition, behavioral modification will be initiated.  This will include frequent elevation, use of over the counter pain medications and exercise such as walking.  I have reviewed systemic causes for chronic edema such as liver, kidney and cardiac etiologies.  The patient denies problems with these organ systems.    Consideration for a lymph pump will also be made based upon the effectiveness of conservative therapy.  This would help to improve the edema control and prevent sequela such as ulcers and infections    Patient should undergo duplex ultrasound of the venous system to ensure that DVT or reflux is not present.  The patient will follow-up with me after the ultrasound.    4. PAD (peripheral artery disease) (HCC)  Recommend:  The patient has evidence of atherosclerosis of the lower extremities with claudication.    The patient should continue walking and begin a more formal exercise program.  The patient should continue antiplatelet therapy and aggressive treatment of the lipid abnormalities  No changes in the patient's medications at this time  The patient should continue wearing graduated compression socks 10-15 mmHg strength to control the mild edema.   - VAS [place] ABI WITH/WO TBI; Future    Hortencia Pilar, MD  04/03/2016 12:50 PM

## 2016-06-06 ENCOUNTER — Encounter: Payer: Self-pay | Admitting: Family Medicine

## 2016-06-06 ENCOUNTER — Ambulatory Visit (INDEPENDENT_AMBULATORY_CARE_PROVIDER_SITE_OTHER): Payer: Commercial Managed Care - PPO | Admitting: Family Medicine

## 2016-06-06 VITALS — BP 132/72 | HR 78 | Temp 98.0°F | Resp 16 | Wt 204.0 lb

## 2016-06-06 DIAGNOSIS — F329 Major depressive disorder, single episode, unspecified: Secondary | ICD-10-CM

## 2016-06-06 DIAGNOSIS — R946 Abnormal results of thyroid function studies: Secondary | ICD-10-CM | POA: Diagnosis not present

## 2016-06-06 DIAGNOSIS — F32A Depression, unspecified: Secondary | ICD-10-CM

## 2016-06-06 DIAGNOSIS — R7989 Other specified abnormal findings of blood chemistry: Secondary | ICD-10-CM

## 2016-06-06 DIAGNOSIS — F419 Anxiety disorder, unspecified: Secondary | ICD-10-CM

## 2016-06-06 MED ORDER — SERTRALINE HCL 25 MG PO TABS: 25.0000 mg | ORAL_TABLET | Freq: Every day | ORAL | 11 refills | Status: DC

## 2016-06-06 NOTE — Progress Notes (Signed)
Subjective:  HPI Pt is here for a 6 month follow up of her chronic problems. Hypothyroidism, anxiety, and depression. She reports that she is feeling well emotionally. She reports that she is having some leg and hip pain but Dr.Snear is doing a work up and ordered an ultrasound that is scheduled. She was told by him that if the test was normal that she may need to see PCP about this. She reports that she is trying to get exercise, she is active at work and tried to do yard work and walk her dog. She is due for her routine lab work but she has a CPE scheduled for 06/28/16.   Depression screen [facility] 2/9 06/06/2016 12/08/2015 10/06/2015 06/28/2015 10/21/2014  Decreased Interest 1 1 2  0 0  Down, Depressed, Hopeless 1 1 1  0 0  PHQ - 2 Score 2 2 3  0 0  Altered sleeping 0 2 3 - -  Tired, decreased energy 1 1 3  - -  Change in appetite 0 1 1 - -  Feeling bad or failure about yourself  1 0 1 - -  Trouble concentrating 0 0 1 - -  Moving slowly or fidgety/restless 0 1 1 - -  Suicidal thoughts 0 0 0 - -  PHQ-9 Score 4 7 13  - -  Difficult doing work/chores - Somewhat difficult - - -     Prior to Admission medications   Medication Sig Start Date End Date Taking? Authorizing Provider  acetaminophen (TYLENOL) 325 MG tablet Take by mouth.    [provider]  ALPRAZolam Duanne Moron) 0.25 MG tablet TAKE ONE TABLET BY MOUTH AT BEDTIME AS NEEDED FOR ANXIETY 02/04/16   Jerrol Banana., MD  aspirin (ASPIRIN 81) 81 MG chewable tablet Chew by mouth daily.    [provider]  ibuprofen (ADVIL,MOTRIN) 800 MG tablet Take by mouth.    [provider]  Multiple Vitamins-Minerals (CENTRUM SILVER) CHEW Chew by mouth.    [provider]  Omega-3 Fatty Acids (FISH OIL) 1000 MG CAPS Take by mouth.    [provider]  ranitidine (ZANTAC) 150 MG tablet Take 1 tablet (150 mg total) by mouth 2 (two) times daily. 06/25/14   Jerrol Banana., MD  vitamin E (VITAMIN E) 400 UNIT  capsule Take 400 Units by mouth daily.    [provider]    Patient Active Problem List   Diagnosis Date Noted  . Leg pain 04/03/2016  . Chronic venous insufficiency 04/03/2016  . Lymphedema 04/03/2016  . PAD (peripheral artery disease) ([facility]) 04/03/2016  . Colon polyp 05/13/2014  . Adiposity 05/13/2014  . Gastroduodenal ulcer 05/13/2014  . DD (diverticular disease) 05/13/2014    Past Medical History:  Diagnosis Date  . Colon polyp   . Diverticulosis   . Gastroduodenal ulcer   . GERD (gastroesophageal reflux disease)     Social History   Social History  . Marital status: Married    Spouse name: N/A  . Number of children: N/A  . Years of education: N/A   Occupational History  . Not on file.   Social History Main Topics  . Smoking status: Never Smoker  . Smokeless tobacco: Never Used  . Alcohol use 0.0 oz/week     Comment: 1 beer month  . Drug use: No  . Sexual activity: Not on file   Other Topics Concern  . Not on file   Social History Narrative  . No narrative on file  No Known Allergies  Review of Systems  Constitutional: Negative.   HENT: Negative.   Eyes: Negative.   Respiratory: Negative.   Cardiovascular: Negative.   Gastrointestinal: Negative.   Genitourinary: Negative.   Musculoskeletal: Positive for joint pain.  Skin: Negative.   Neurological: Negative.   Endo/Heme/Allergies: Negative.   Psychiatric/Behavioral: Negative.     Immunization History  Administered Date(s) Administered  . Tdap 12/09/2013    Objective:  BP 132/72 (BP Location: Left Arm, Patient Position: Sitting, Cuff Size: Large)   Pulse 78   Temp 98 F (36.7 C) (Oral)   Resp 16   Wt 204 lb (92.5 kg)   BMI 35.02 kg/m   Physical Exam  Constitutional: She is well-developed, well-nourished, and in no distress.  HENT:  Head: Normocephalic and atraumatic.  Eyes: Conjunctivae are normal.  Neck: No thyromegaly present.  Cardiovascular: Normal rate, regular  rhythm and normal heart sounds.   Pulmonary/Chest: Effort normal and breath sounds normal.  Abdominal: Soft.  Neurological: She is alert.  Skin: Skin is warm and dry.  Psychiatric: Mood, memory, affect and judgment normal.    Lab Results  Component Value Date   WBC 8.1 06/28/2015   HGB 13.8 12/10/2013   HCT 38.8 06/28/2015   PLT 293 06/28/2015   GLUCOSE 89 06/28/2015   CHOL 217 (H) 06/28/2015   TRIG 123 06/28/2015   HDL 62 06/28/2015   LDLCALC 130 (H) 06/28/2015   TSH 3.910 10/06/2015    CMP     Component Value Date/Time   NA 144 06/28/2015 1018   K 4.5 06/28/2015 1018   CL 103 06/28/2015 1018   CO2 23 06/28/2015 1018   GLUCOSE 89 06/28/2015 1018   BUN 15 06/28/2015 1018   CREATININE 0.76 06/28/2015 1018   CALCIUM 9.4 06/28/2015 1018   PROT 7.3 06/28/2015 1018   ALBUMIN 4.7 06/28/2015 1018   AST 17 06/28/2015 1018   ALT 19 06/28/2015 1018   ALKPHOS 115 06/28/2015 1018   BILITOT 0.4 06/28/2015 1018   GFRNONAA 86 06/28/2015 1018   GFRAA 99 06/28/2015 1018    Assessment and Plan :  1. Depression, unspecified depression type  - sertraline (ZOLOFT) 25 MG tablet; Take 1 tablet (25 mg total) by mouth daily.  Dispense: 30 tablet; Refill: 11  2. Acute anxiety Zoloft.  3. Abnormal TSH  I have done the exam and reviewed the chart and it is accurate to the best of my knowledge. Development worker, community has been used and  any errors in dictation or transcription are unintentional. Miguel Aschoff M.D. [place]er MD [facility] 06/06/2016 4:12 PM

## 2016-06-07 ENCOUNTER — Other Ambulatory Visit (INDEPENDENT_AMBULATORY_CARE_PROVIDER_SITE_OTHER): Payer: Commercial Managed Care - PPO

## 2016-06-07 ENCOUNTER — Ambulatory Visit (INDEPENDENT_AMBULATORY_CARE_PROVIDER_SITE_OTHER): Payer: Commercial Managed Care - PPO | Admitting: Vascular Surgery

## 2016-06-07 ENCOUNTER — Encounter (INDEPENDENT_AMBULATORY_CARE_PROVIDER_SITE_OTHER): Payer: Commercial Managed Care - PPO

## 2016-06-07 ENCOUNTER — Encounter (INDEPENDENT_AMBULATORY_CARE_PROVIDER_SITE_OTHER): Payer: Self-pay | Admitting: Vascular Surgery

## 2016-06-07 ENCOUNTER — Encounter (INDEPENDENT_AMBULATORY_CARE_PROVIDER_SITE_OTHER): Payer: Self-pay

## 2016-06-07 VITALS — BP 150/92 | HR 66 | Resp 16 | Ht 64.0 in | Wt 201.0 lb

## 2016-06-07 DIAGNOSIS — I890 Lymphedema, not elsewhere classified: Secondary | ICD-10-CM | POA: Diagnosis not present

## 2016-06-07 DIAGNOSIS — M79604 Pain in right leg: Secondary | ICD-10-CM | POA: Diagnosis not present

## 2016-06-07 DIAGNOSIS — I872 Venous insufficiency (chronic) (peripheral): Secondary | ICD-10-CM

## 2016-06-07 DIAGNOSIS — M79605 Pain in left leg: Secondary | ICD-10-CM

## 2016-06-07 NOTE — Progress Notes (Signed)
Subjective:    Patient ID: Danielle Davenport, female    DOB: [birth year], 61 y.o.   MRN: [id number] Chief Complaint  Patient presents with  . Re-evaluation    Ultrasound follow up   Patient last seen on 04/03/16 for evaluation of lower extremity pain. Since then, she has been wearing medical grade one compression, elevating her legs and remaining active as encouraged with minimal relief in symptoms. Patient underwent a venous duplex today which was notable for venous incompetence of the left GSV (possible right GSV reflux vs accessory reflux as per ultrasound) and bilateral SSV reflux. No DVT or SVT. Symptoms are stable.    Review of Systems  Constitutional: Negative.   Eyes: Negative.   Respiratory: Negative.   Cardiovascular:       Lower extremity pain (bilateral)  Gastrointestinal: Negative.   Endocrine: Negative.   Genitourinary: Negative.   Musculoskeletal: Negative.   Skin: Negative.   Allergic/Immunologic: Negative.   Neurological: Negative.   Hematological: Negative.   Psychiatric/Behavioral: Negative.       Objective:   Physical Exam  Constitutional: She is oriented to person, place, and time. She appears well-developed.  HENT:  Head: Normocephalic and atraumatic.  Eyes: Conjunctivae are normal. Pupils are equal, round, and reactive to light.  Neck: Normal range of motion.  Cardiovascular: Normal rate, regular rhythm, normal heart sounds and intact distal pulses.   Pulses:      Radial pulses are 2+ on the right side, and 2+ on the left side.       Dorsalis pedis pulses are 2+ on the right side, and 2+ on the left side.       Posterior tibial pulses are 2+ on the right side, and 2+ on the left side.  Pulmonary/Chest: Effort normal.  Musculoskeletal: Normal range of motion. She exhibits edema (Minimall edema).  Neurological: She is alert and oriented to person, place, and time.  Skin: Skin is warm and dry.  Psychiatric: She has a normal mood and affect. Her  behavior is normal. Judgment and thought content normal.  Vitals reviewed.  BP (!) 150/92 (BP Location: Right Arm)   Pulse 66   Resp 16   Ht 5\' 4"  (1.626 m)   Wt 201 lb (91.2 kg)   BMI 34.50 kg/m   Past Medical History:  Diagnosis Date  . Colon polyp   . Diverticulosis   . Gastroduodenal ulcer   . GERD (gastroesophageal reflux disease)    Social History   Social History  . Marital status: Married    Spouse name: N/A  . Number of children: N/A  . Years of education: N/A   Occupational History  . Not on file.   Social History Main Topics  . Smoking status: Never Smoker  . Smokeless tobacco: Never Used  . Alcohol use 0.0 oz/week     Comment: 1 beer month  . Drug use: No  . Sexual activity: Not on file   Other Topics Concern  . Not on file   Social History Narrative  . No narrative on file   Past Surgical History:  Procedure Laterality Date  . BREAST BIOPSY Left 2000  . INCONTINENCE SURGERY    . TUBAL LIGATION    . UTERINE FIBROID SURGERY     Family History  Problem Relation Age of Onset  . Breast cancer Mother 64  . Cirrhosis Father   . COPD Brother   . COPD Daughter   . Emphysema Maternal Grandfather   .  Stroke Paternal Grandfather    No Known Allergies     Assessment & Plan:  Patient last seen on 04/03/16 for evaluation of lower extremity pain. Since then, she has been wearing medical grade one compression, elevating her legs and remaining active as encouraged with minimal relief in symptoms. Patient underwent a venous duplex today which was notable for venous incompetence of the left GSV (possible right GSV reflux vs accessory reflux as per ultrasound) and bilateral SSV reflux. No DVT or SVT. Symptoms are stable.   1. Chronic venous insufficiency - Stable Patient with bilateral GSV and SSV reflux. Would be a good candidate for laser if symptoms do not improve with conservative therapy. Will bring patient back at end of conservative trial period. The  patient was encouraged to wear graduated compression stockings (20-30 mmHg) on a daily basis. The patient was instructed to begin wearing the stockings first thing in the morning and removing them in the evening. The patient was instructed specifically not to sleep in the stockings.  In addition, behavioral modification including elevation during the day will be initiated. Anti-inflammatories for pain.  2. Lymphedema - Stable As above  3. Pain in both lower extremities - Stable As above  Current Outpatient Prescriptions on File Prior to Visit  Medication Sig Dispense Refill  . acetaminophen (TYLENOL) 325 MG tablet Take by mouth.    . ALPRAZolam (XANAX) 0.25 MG tablet TAKE ONE TABLET BY MOUTH AT BEDTIME AS NEEDED FOR ANXIETY 30 tablet 5  . aspirin (ASPIRIN 81) 81 MG chewable tablet Chew by mouth daily.    Marland Kitchen ibuprofen (ADVIL,MOTRIN) 800 MG tablet Take by mouth.    . Multiple Vitamins-Minerals (CENTRUM SILVER) CHEW Chew by mouth.    . Omega-3 Fatty Acids (FISH OIL) 1000 MG CAPS Take by mouth.    Marland Kitchen omeprazole (PRILOSEC) 20 MG capsule Take 20 mg by mouth daily.    . ranitidine (ZANTAC) 150 MG tablet Take 1 tablet (150 mg total) by mouth 2 (two) times daily. 60 tablet 12  . sertraline (ZOLOFT) 25 MG tablet Take 1 tablet (25 mg total) by mouth daily. 30 tablet 11  . vitamin E (VITAMIN E) 400 UNIT capsule Take 400 Units by mouth daily.     No current facility-administered medications on file prior to visit.    There are no Patient Instructions on file for this visit. No Follow-up on file.  Yaris Ferrell A Santiaga Butzin, PA-C

## 2016-06-28 ENCOUNTER — Ambulatory Visit (INDEPENDENT_AMBULATORY_CARE_PROVIDER_SITE_OTHER): Payer: Commercial Managed Care - PPO | Admitting: Family Medicine

## 2016-06-28 ENCOUNTER — Encounter: Payer: Self-pay | Admitting: Family Medicine

## 2016-06-28 VITALS — BP 130/72 | HR 76 | Temp 98.2°F | Resp 16 | Ht 63.0 in | Wt 202.0 lb

## 2016-06-28 DIAGNOSIS — Z0000 Encounter for general adult medical examination without abnormal findings: Secondary | ICD-10-CM

## 2016-06-28 DIAGNOSIS — F329 Major depressive disorder, single episode, unspecified: Secondary | ICD-10-CM

## 2016-06-28 DIAGNOSIS — F32A Depression, unspecified: Secondary | ICD-10-CM

## 2016-06-28 MED ORDER — SERTRALINE HCL 50 MG PO TABS: 50.0000 mg | ORAL_TABLET | Freq: Every day | ORAL | 11 refills | Status: DC

## 2016-06-28 NOTE — Progress Notes (Signed)
Patient: Danielle Davenport, Female    DOB: 1955/05/04, 61 y.o.   MRN: [id number] Visit Date: 06/28/2016  Today's Provider: Wilhemena Durie, MD   Chief Complaint  Patient presents with  . Annual Exam   Subjective:    Annual physical exam Danielle Davenport [name] is a 61 y.o. female who presents today for health maintenance and complete physical. She feels well. She reports she is exercising. She swims in her pool and is active at work. She reports she is sleeping well.  Last mammogram- 10/14/2015- negative Last pap- F/B GYN Last colonoscopy- 07/28/2009- hemorrhoids -----------------------------------------------------------------   Review of Systems  Constitutional: Negative.   HENT: Negative.   Eyes: Negative.   Respiratory: Negative.   Cardiovascular: Positive for leg swelling (f/b vascular). Negative for chest pain and palpitations.  Gastrointestinal: Negative.   Endocrine: Negative.   Genitourinary: Negative.   Musculoskeletal: Positive for back pain. Negative for arthralgias, gait problem, joint swelling, myalgias, neck pain and neck stiffness.  Skin: Negative.   Allergic/Immunologic: Negative.   Neurological: Negative.   Hematological: Negative.   Psychiatric/Behavioral: Negative for agitation, behavioral problems, confusion, decreased concentration, dysphoric mood, hallucinations, self-injury, sleep disturbance and suicidal ideas. The patient is nervous/anxious (well controlled on medications). The patient is not hyperactive.     Social History      She  reports that she has never smoked. She has never used smokeless tobacco. She reports that she does not drink alcohol or use drugs.       Social History   Social History  . Marital status: Married    Spouse name: Jori Moll  . Number of children: 2  . Years of education: HS   Occupational History  .       [employer]   Social History Main Topics  . Smoking status: Never Smoker  .  Smokeless tobacco: Never Used  . Alcohol use No  . Drug use: No  . Sexual activity: Yes    Birth control/ protection: Post-menopausal   Other Topics Concern  . None   Social History Narrative  . None    Past Medical History:  Diagnosis Date  . Colon polyp   . Diverticulosis   . Gastroduodenal ulcer   . GERD (gastroesophageal reflux disease)      Patient Active Problem List   Diagnosis Date Noted  . Leg pain 04/03/2016  . Chronic venous insufficiency 04/03/2016  . Lymphedema 04/03/2016  . PAD (peripheral artery disease) ([place]) 04/03/2016  . Colon polyp 05/13/2014  . Adiposity 05/13/2014  . Gastroduodenal ulcer 05/13/2014  . DD (diverticular disease) 05/13/2014    Past Surgical History:  Procedure Laterality Date  . BREAST BIOPSY Left 2000  . INCONTINENCE SURGERY    . TUBAL LIGATION    . UTERINE FIBROID SURGERY      Family History        Family Status  Relation Status  . Mother Deceased  . Father Deceased  . Brother Alive  . Daughter Alive  . Son Alive  . MGF (Not Specified)  . PGF (Not Specified)        Her family history includes Breast cancer (age of onset: 25) in her mother; COPD in her brother and daughter; Cirrhosis in her father; Emphysema in her maternal grandfather; Stroke in her paternal grandfather.     No Known Allergies   Current Outpatient Prescriptions:  .  acetaminophen (TYLENOL) 325 MG tablet, Take by mouth., Disp: ,  Rfl:  .  ALPRAZolam (XANAX) 0.25 MG tablet, TAKE ONE TABLET BY MOUTH AT BEDTIME AS NEEDED FOR ANXIETY, Disp: 30 tablet, Rfl: 5 .  aspirin (ASPIRIN 81) 81 MG chewable tablet, Chew by mouth daily., Disp: , Rfl:  .  ibuprofen (ADVIL,MOTRIN) 800 MG tablet, Take by mouth., Disp: , Rfl:  .  Multiple Vitamins-Minerals (CENTRUM SILVER) CHEW, Chew by mouth., Disp: , Rfl:  .  Omega-3 Fatty Acids (FISH OIL) 1000 MG CAPS, Take by mouth., Disp: , Rfl:  .  omeprazole (PRILOSEC) 20 MG capsule, Take 20 mg by mouth daily., Disp: , Rfl:  .   ranitidine (ZANTAC) 150 MG tablet, Take 1 tablet (150 mg total) by mouth 2 (two) times daily., Disp: 60 tablet, Rfl: 12 .  sertraline (ZOLOFT) 25 MG tablet, Take 1 tablet (25 mg total) by mouth daily., Disp: 30 tablet, Rfl: 11 .  vitamin E (VITAMIN E) 400 UNIT capsule, Take 400 Units by mouth daily., Disp: , Rfl:    Patient Care Team: Jerrol Banana., MD as PCP - General (Family Medicine)      Objective:   Vitals: BP 130/72 (BP Location: Right Arm, Patient Position: Sitting, Cuff Size: Large)   Pulse 76   Temp 98.2 F (36.8 C) (Oral)   Resp 16   Ht 5\' 3"  (1.6 m)   Wt 202 lb (91.6 kg)   BMI 35.78 kg/m    Vitals:   06/28/16 0858  BP: 130/72  Pulse: 76  Resp: 16  Temp: 98.2 F (36.8 C)  TempSrc: Oral  Weight: 202 lb (91.6 kg)  Height: 5\' 3"  (1.6 m)     Physical Exam  Constitutional: She is oriented to person, place, and time. She appears well-developed and well-nourished.  HENT:  Head: Normocephalic and atraumatic.  Right Ear: Tympanic membrane, external ear and ear canal normal.  Left Ear: Tympanic membrane, external ear and ear canal normal.  Nose: Nose normal.  Mouth/Throat: Uvula is midline, oropharynx is clear and moist and mucous membranes are normal.  Eyes: Conjunctivae, EOM and lids are normal. Pupils are equal, round, and reactive to light.  Neck: Trachea normal and normal range of motion. Neck supple. Carotid bruit is not present. No thyroid mass and no thyromegaly present.  Cardiovascular: Normal rate, regular rhythm and normal heart sounds.   Pulmonary/Chest: Effort normal and breath sounds normal.  Abdominal: Soft. Normal appearance and bowel sounds are normal. There is no hepatosplenomegaly. There is no tenderness.  Musculoskeletal: Normal range of motion.  Lymphadenopathy:    She has no cervical adenopathy.    She has no axillary adenopathy.  Neurological: She is alert and oriented to person, place, and time. She has normal strength. No cranial  nerve deficit.  Skin: Skin is warm, dry and intact.  Psychiatric: She has a normal mood and affect. Her speech is normal and behavior is normal. Judgment and thought content normal. Cognition and memory are normal.     Depression Screen PHQ 2/9 Scores 06/28/2016 06/06/2016 12/08/2015 10/06/2015  PHQ - 2 Score 1 2 2 3   PHQ- 9 Score 3 4 7 13       Assessment & Plan:     Routine Health Maintenance and Physical Exam  Exercise Activities and Dietary recommendations Goals    None      Immunization History  Administered Date(s) Administered  . Tdap 12/09/2013    Health Maintenance  Topic Date Due  . HIV Screening  05/20/1970  . INFLUENZA VACCINE  08/09/2016  .  PAP SMEAR  10/09/2016  . MAMMOGRAM  10/13/2017  . COLONOSCOPY  07/29/2019  . TETANUS/TDAP  12/10/2023  . Hepatitis C Screening  Completed     Discussed health benefits of physical activity, and encouraged her to engage in regular exercise appropriate for her age and condition.    -------------------------------------------------------------------- 1. Annual physical exam Stable. FU pending lab results. - Lipid panel - CBC with Differential/Platelet - Comprehensive metabolic panel - TSH  2. Depression, unspecified depression type Pt tolerated 25 mg well, and agrees to increase to Zoloft to 50 mg. Recheck 2 months.Presently 20% better. - sertraline (ZOLOFT) 50 MG tablet; Take 1 tablet (50 mg total) by mouth daily.  Dispense: 30 tablet; Refill: 11  I have done the exam and reviewed the above chart and it is accurate to the best of my knowledge. Development worker, community has been used in this note in any air is in the dictation or transcription are unintentional.  Wilhemena Durie, MD  [place]

## 2016-06-29 LAB — LIPID PANEL
CHOL/HDL RATIO: 3.9 ratio (ref 0.0–4.4)
Cholesterol, Total: 199 mg/dL (ref 100–199)
HDL: 51 mg/dL (ref >39)
LDL Calculated: 108 mg/dL — ABNORMAL HIGH (ref 0–99)
Triglycerides: 200 mg/dL — ABNORMAL HIGH (ref 0–149)
VLDL CHOLESTEROL CAL: 40 mg/dL (ref 5–40)

## 2016-06-29 LAB — CBC WITH DIFFERENTIAL/PLATELET
BASOS ABS: 0.1 10*3/uL (ref 0.0–0.2)
Basos: 1 %
EOS (ABSOLUTE): 0.2 10*3/uL (ref 0.0–0.4)
Eos: 3 %
Hematocrit: 38.9 % (ref 34.0–46.6)
Hemoglobin: 12.9 g/dL (ref 11.1–15.9)
Immature Grans (Abs): 0 10*3/uL (ref 0.0–0.1)
Immature Granulocytes: 0 %
LYMPHS ABS: 2.9 10*3/uL (ref 0.7–3.1)
Lymphs: 32 %
MCH: 28.6 pg (ref 26.6–33.0)
MCHC: 33.2 g/dL (ref 31.5–35.7)
MCV: 86 fL (ref 79–97)
MONOS ABS: 0.6 10*3/uL (ref 0.1–0.9)
Monocytes: 7 %
NEUTROS PCT: 57 %
Neutrophils Absolute: 5.1 10*3/uL (ref 1.4–7.0)
PLATELETS: 256 10*3/uL (ref 150–379)
RBC: 4.51 x10E6/uL (ref 3.77–5.28)
RDW: 14.6 % (ref 12.3–15.4)
WBC: 8.9 10*3/uL (ref 3.4–10.8)

## 2016-06-29 LAB — COMPREHENSIVE METABOLIC PANEL
ALK PHOS: 105 IU/L (ref 39–117)
ALT: 20 IU/L (ref 0–32)
AST: 22 IU/L (ref 0–40)
Albumin/Globulin Ratio: 1.9 (ref 1.2–2.2)
Albumin: 4.7 g/dL (ref 3.6–4.8)
BUN/Creatinine Ratio: 19 (ref 12–28)
BUN: 14 mg/dL (ref 8–27)
Bilirubin Total: 0.4 mg/dL (ref 0.0–1.2)
CO2: 24 mmol/L (ref 20–29)
CREATININE: 0.72 mg/dL (ref 0.57–1.00)
Calcium: 9.1 mg/dL (ref 8.7–10.3)
Chloride: 103 mmol/L (ref 96–106)
GFR calc Af Amer: 105 mL/min/{1.73_m2} (ref >59)
GFR calc non Af Amer: 91 mL/min/{1.73_m2} (ref >59)
GLUCOSE: 90 mg/dL (ref 65–99)
Globulin, Total: 2.5 g/dL (ref 1.5–4.5)
Potassium: 4.2 mmol/L (ref 3.5–5.2)
SODIUM: 142 mmol/L (ref 134–144)
Total Protein: 7.2 g/dL (ref 6.0–8.5)

## 2016-06-29 LAB — TSH: TSH: 5.63 u[IU]/mL — AB (ref 0.450–4.500)

## 2016-06-29 NOTE — Progress Notes (Signed)
Advised  ED 

## 2016-07-06 ENCOUNTER — Encounter (INDEPENDENT_AMBULATORY_CARE_PROVIDER_SITE_OTHER): Payer: Self-pay | Admitting: Vascular Surgery

## 2016-07-06 ENCOUNTER — Ambulatory Visit (INDEPENDENT_AMBULATORY_CARE_PROVIDER_SITE_OTHER): Payer: Commercial Managed Care - PPO | Admitting: Vascular Surgery

## 2016-07-06 VITALS — BP 142/80 | HR 70 | Resp 16 | Ht 63.0 in | Wt 199.0 lb

## 2016-07-06 DIAGNOSIS — I890 Lymphedema, not elsewhere classified: Secondary | ICD-10-CM

## 2016-07-06 DIAGNOSIS — M79605 Pain in left leg: Secondary | ICD-10-CM | POA: Diagnosis not present

## 2016-07-06 DIAGNOSIS — M79604 Pain in right leg: Secondary | ICD-10-CM

## 2016-07-06 DIAGNOSIS — I872 Venous insufficiency (chronic) (peripheral): Secondary | ICD-10-CM

## 2016-07-06 NOTE — Progress Notes (Signed)
Subjective:    Patient ID: Danielle Davenport, female    DOB: 05/12/1955, 61 y.o.   MRN: [id number] Chief Complaint  Patient presents with  . Follow-up   Patient presents for a 3 month follow-up in regard to bilateral chronic venous insufficiency. Since her initial visit in March 2018, the patient has been wearing medical grade 1 compression, elevating her legs and remaining active. Patient states that this seems to be controlling her symptoms. Her edema and discomfort have improved. Denies any fever, nausea or vomiting.   Review of Systems  Constitutional: Negative.   HENT: Negative.   Eyes: Negative.   Respiratory: Negative.   Cardiovascular: Positive for leg swelling.  Gastrointestinal: Negative.   Endocrine: Negative.   Genitourinary: Negative.   Musculoskeletal: Negative.   Skin: Negative.   Allergic/Immunologic: Negative.   Neurological: Negative.   Hematological: Negative.   Psychiatric/Behavioral: Negative.       Objective:   Physical Exam  Constitutional: She is oriented to person, place, and time. She appears well-developed and well-nourished. No distress.  HENT:  Head: Normocephalic and atraumatic.  Eyes: Conjunctivae are normal. Pupils are equal, round, and reactive to light.  Neck: Normal range of motion.  Cardiovascular: Normal rate, regular rhythm, normal heart sounds and intact distal pulses.   Pulses:      Radial pulses are 2+ on the right side, and 2+ on the left side.       Dorsalis pedis pulses are 2+ on the right side, and 2+ on the left side.       Posterior tibial pulses are 2+ on the right side, and 2+ on the left side.  Pulmonary/Chest: Effort normal.  Musculoskeletal: Normal range of motion. She exhibits no edema.  Neurological: She is alert and oriented to person, place, and time.  Skin: Skin is warm and dry. She is not diaphoretic.  Psychiatric: She has a normal mood and affect. Her behavior is normal. Judgment and thought content normal.   Vitals reviewed.  BP (!) 142/80   Pulse 70   Resp 16   Ht 5\' 3"  (1.6 m)   Wt 199 lb (90.3 kg)   BMI 35.25 kg/m   Past Medical History:  Diagnosis Date  . Colon polyp   . Diverticulosis   . Gastroduodenal ulcer   . GERD (gastroesophageal reflux disease)    Social History   Social History  . Marital status: Married    Spouse name: Jori Moll  . Number of children: 2  . Years of education: HS   Occupational History  .       [employer]   Social History Main Topics  . Smoking status: Never Smoker  . Smokeless tobacco: Never Used  . Alcohol use No  . Drug use: No  . Sexual activity: Yes    Birth control/ protection: Post-menopausal   Other Topics Concern  . Not on file   Social History Narrative  . No narrative on file   Past Surgical History:  Procedure Laterality Date  . BREAST BIOPSY Left 2000  . INCONTINENCE SURGERY    . TUBAL LIGATION    . UTERINE FIBROID SURGERY     Family History  Problem Relation Age of Onset  . Breast cancer Mother 69  . Cirrhosis Father   . COPD Brother   . COPD Daughter   . Emphysema Maternal Grandfather   . Stroke Paternal Grandfather    No Known Allergies     Assessment &  Plan:  Patient presents for a 3 month follow-up in regard to bilateral chronic venous insufficiency. Since her initial visit in March 2018, the patient has been wearing medical grade 1 compression, elevating her legs and remaining active. Patient states that this seems to be controlling her symptoms. Her edema and discomfort have improved. Denies any fever, nausea or vomiting.  1. Chronic venous insufficiency - Stable Patient with reflux in the bilateral greater saphenous and small saphenous veins. Patient has been wearing medical grade 1 compression, engaging in elevation and remaining active with improvement in her symptoms. We discussed moving forward with endovascular laser ablation however the patient is not interested in this therapy at this  time. Patient's symptoms seem to be controlled with conservative therapy Patient to follow up when necessary  2. Lymphedema - stable Patient continues to wear medical grade 1 compression, engage in elevation, remains active and uses her lymphedema pump on a daily basis.  3. Pain in both lower extremities - improvement Since our initial visit and the use of conservative therapy the patient's lower extremity pain has improved.  Current Outpatient Prescriptions on File Prior to Visit  Medication Sig Dispense Refill  . acetaminophen (TYLENOL) 325 MG tablet Take by mouth.    . ALPRAZolam (XANAX) 0.25 MG tablet TAKE ONE TABLET BY MOUTH AT BEDTIME AS NEEDED FOR ANXIETY 30 tablet 5  . aspirin (ASPIRIN 81) 81 MG chewable tablet Chew by mouth daily.    Marland Kitchen ibuprofen (ADVIL,MOTRIN) 800 MG tablet Take by mouth.    . Multiple Vitamins-Minerals (CENTRUM SILVER) CHEW Chew by mouth.    . Omega-3 Fatty Acids (FISH OIL) 1000 MG CAPS Take by mouth.    Marland Kitchen omeprazole (PRILOSEC) 20 MG capsule Take 20 mg by mouth daily.    . ranitidine (ZANTAC) 150 MG tablet Take 1 tablet (150 mg total) by mouth 2 (two) times daily. 60 tablet 12  . sertraline (ZOLOFT) 50 MG tablet Take 1 tablet (50 mg total) by mouth daily. 30 tablet 11  . vitamin E (VITAMIN E) 400 UNIT capsule Take 400 Units by mouth daily.     No current facility-administered medications on file prior to visit.     There are no Patient Instructions on file for this visit. No Follow-up on file.   Ladajah Soltys A Amel Kitch, PA-C

## 2016-08-29 ENCOUNTER — Ambulatory Visit (INDEPENDENT_AMBULATORY_CARE_PROVIDER_SITE_OTHER): Payer: Commercial Managed Care - PPO | Admitting: Family Medicine

## 2016-08-29 VITALS — BP 122/64 | HR 76 | Temp 97.8°F | Resp 14 | Wt 205.6 lb

## 2016-08-29 DIAGNOSIS — R946 Abnormal results of thyroid function studies: Secondary | ICD-10-CM | POA: Diagnosis not present

## 2016-08-29 DIAGNOSIS — F3289 Other specified depressive episodes: Secondary | ICD-10-CM | POA: Diagnosis not present

## 2016-08-29 DIAGNOSIS — Z6836 Body mass index (BMI) 36.0-36.9, adult: Secondary | ICD-10-CM

## 2016-08-29 DIAGNOSIS — F419 Anxiety disorder, unspecified: Secondary | ICD-10-CM

## 2016-08-29 DIAGNOSIS — R7989 Other specified abnormal findings of blood chemistry: Secondary | ICD-10-CM

## 2016-08-29 NOTE — Progress Notes (Signed)
Danielle Davenport  MRN: [id number] DOB: October 14, 1955  Subjective:  HPI  Patient is here for 2 months follow up. Last office visit was on 06/28/16 for CPE. Depression was addressed at that time and Sertraline increased to 50 mg at that time. Patient took this medication for about 2 to 3 weeks after last visit and then stopped due to developing clinching and grinding teeth and uncontrollable bowels and she just could not handle it. She is off the medication completely. Symptoms have resolved completely since stopping medication. Feels like she is doing ok emotionally, could no tell much of improvement on the medication to begin with. She uses Xanax as needed basis. Routine lab work was don 6/201/18 and was ok per that note. Pt has gained weight due to admitted sweet tooth. Patient Active Problem List   Diagnosis Date Noted  . Leg pain 04/03/2016  . Chronic venous insufficiency 04/03/2016  . Lymphedema 04/03/2016  . PAD (peripheral artery disease) ([place]) 04/03/2016  . Colon polyp 05/13/2014  . Adiposity 05/13/2014  . Gastroduodenal ulcer 05/13/2014  . DD (diverticular disease) 05/13/2014    Past Medical History:  Diagnosis Date  . Colon polyp   . Diverticulosis   . Gastroduodenal ulcer   . GERD (gastroesophageal reflux disease)     Social History   Social History  . Marital status: Married    Spouse name: Jori Moll  . Number of children: 2  . Years of education: HS   Occupational History  .       [employer]   Social History Main Topics  . Smoking status: Never Smoker  . Smokeless tobacco: Never Used  . Alcohol use No  . Drug use: No  . Sexual activity: Yes    Birth control/ protection: Post-menopausal   Other Topics Concern  . Not on file   Social History Narrative  . No narrative on file    Outpatient Encounter Prescriptions as of 08/29/2016  Medication Sig Note  . acetaminophen (TYLENOL) 325 MG tablet Take by mouth. 05/13/2014: Medication taken as  needed.  Received from: [facility]  . ALPRAZolam (XANAX) 0.25 MG tablet TAKE ONE TABLET BY MOUTH AT BEDTIME AS NEEDED FOR ANXIETY   . aspirin (ASPIRIN 81) 81 MG chewable tablet Chew by mouth daily.   Marland Kitchen ibuprofen (ADVIL,MOTRIN) 800 MG tablet Take by mouth. 05/13/2014: Received from: [facility]  . Multiple Vitamins-Minerals (CENTRUM SILVER) CHEW Chew by mouth. 05/13/2014: Received from: [facility]  . Omega-3 Fatty Acids (FISH OIL) 1000 MG CAPS Take by mouth. 05/13/2014: Received from: [facility]  . omeprazole (PRILOSEC) 20 MG capsule Take 20 mg by mouth daily.   . ranitidine (ZANTAC) 150 MG tablet Take 1 tablet (150 mg total) by mouth 2 (two) times daily.   . vitamin E (VITAMIN E) 400 UNIT capsule Take 400 Units by mouth daily.   . [DISCONTINUED] sertraline (ZOLOFT) 50 MG tablet Take 1 tablet (50 mg total) by mouth daily.    No facility-administered encounter medications on file as of 08/29/2016.     Allergies  Allergen Reactions  . Sertraline Hcl     uncontrollable bowels, grinding and clinching teeth.    Review of Systems  Constitutional: Negative.   Eyes: Negative.   Respiratory: Negative.   Cardiovascular: Negative.   Musculoskeletal: Negative.   Skin: Negative.   Endo/Heme/Allergies: Negative.   Psychiatric/Behavioral: The patient is nervous/anxious (sometimes) and has insomnia (sometimes).     Objective:  BP 122/64   Pulse 76   Temp 97.8 F (36.6 C)   Resp 14   Wt 205 lb 9.6 oz (93.3 kg)   BMI 36.42 kg/m   Physical Exam  Constitutional: She is oriented to person, place, and time and well-developed, well-nourished, and in no distress.  HENT:  Head: Normocephalic and atraumatic.  Eyes: Pupils are equal, round, and reactive to light. Conjunctivae are normal.  Neck: Normal range of motion. Neck supple.  Cardiovascular: Normal rate, regular rhythm, normal heart sounds and intact distal pulses.  Exam  reveals no gallop.   No murmur heard. Pulmonary/Chest: Effort normal and breath sounds normal. No respiratory distress. She has no wheezes.  Musculoskeletal: She exhibits no edema or tenderness.  Neurological: She is alert and oriented to person, place, and time.  Psychiatric: Mood, memory, affect and judgment normal.   Assessment and Plan :  1. Other depression stable off the medication. Did not tolerate Sertraline.  2. Acute anxiety Stable.  3. BMI 36.0-36.9,adult Work on habits, diet and exercise. 4.Borderline Hypothyroid Re check TSH level today, may need to start medication. Follow up in 6 months due to TSH. HPI, Exam and A&P transcribed by Theressa Millard, RMA under direction and in the presence of Miguel Aschoff, MD. I have done the exam and reviewed the chart and it is accurate to the best of my knowledge. Development worker, community has been used and  any errors in dictation or transcription are unintentional. Miguel Aschoff M.D. [facility]

## 2016-08-30 LAB — TSH: TSH: 4.66 u[IU]/mL — AB (ref 0.450–4.500)

## 2016-09-06 ENCOUNTER — Telehealth: Payer: Self-pay

## 2016-09-06 NOTE — Telephone Encounter (Signed)
LMTCB 09/06/2016  Thanks,   -Mickel Baas

## 2016-09-06 NOTE — Telephone Encounter (Signed)
-----   Message from Jerrol Banana., MD sent at 08/30/2016  9:05 AM EDT ----- Heading back to normal.

## 2016-09-12 NOTE — Telephone Encounter (Signed)
[id number] 

## 2016-09-13 NOTE — Telephone Encounter (Signed)
Letter mailed to home address advising patient of lab result-aa

## 2016-12-08 ENCOUNTER — Ambulatory Visit (INDEPENDENT_AMBULATORY_CARE_PROVIDER_SITE_OTHER): Payer: Commercial Managed Care - PPO | Admitting: Obstetrics & Gynecology

## 2016-12-08 ENCOUNTER — Encounter: Payer: Self-pay | Admitting: Obstetrics & Gynecology

## 2016-12-08 VITALS — BP 140/80 | HR 86 | Ht 63.0 in | Wt 206.0 lb

## 2016-12-08 DIAGNOSIS — Z1231 Encounter for screening mammogram for malignant neoplasm of breast: Secondary | ICD-10-CM | POA: Diagnosis not present

## 2016-12-08 DIAGNOSIS — Z1211 Encounter for screening for malignant neoplasm of colon: Secondary | ICD-10-CM

## 2016-12-08 DIAGNOSIS — Z0000 Encounter for general adult medical examination without abnormal findings: Secondary | ICD-10-CM

## 2016-12-08 DIAGNOSIS — Z1239 Encounter for other screening for malignant neoplasm of breast: Secondary | ICD-10-CM

## 2016-12-08 NOTE — Progress Notes (Signed)
HPI:      Danielle Davenport is a 61 y.o. [id number] who LMP was in the past, she presents today for her annual examination.  The patient has no complaints today. The patient is sexually active. Herlast pap: approximate date 2017 and was normal and last mammogram: approximate date 2017 and was normal.  The patient does perform self breast exams.  There is notable family history of breast or ovarian cancer in her family. The patient is not taking hormone replacement therapy. Patient denies post-menopausal vaginal bleeding.   The patient has regular exercise: yes. The patient denies current symptoms of depression.    No further PMB Last 2 weeks has had RLQ mild pain rad to back without modifiers or assoc sx's.  GYN Hx: Last Colonoscopy:2 years ago. Normal. Cologuard done then. Last DEXA: never ago.    PMHx: Past Medical History:  Diagnosis Date  . Colon polyp   . Depression   . Diverticulosis   . Gastroduodenal ulcer   . GERD (gastroesophageal reflux disease)    Past Surgical History:  Procedure Laterality Date  . BREAST BIOPSY Left 2000  . INCONTINENCE SURGERY    . TUBAL LIGATION    . UTERINE FIBROID SURGERY     Family History  Problem Relation Age of Onset  . Breast cancer Mother 45  . Cirrhosis Father   . COPD Brother   . COPD Daughter   . Emphysema Maternal Grandfather   . Stroke Paternal Grandfather    Social History   Tobacco Use  . Smoking status: Never Smoker  . Smokeless tobacco: Never Used  Substance Use Topics  . Alcohol use: No    Alcohol/week: 0.0 oz  . Drug use: No    Current Outpatient Medications:  .  acetaminophen (TYLENOL) 325 MG tablet, Take by mouth., Disp: , Rfl:  .  ALPRAZolam (XANAX) 0.25 MG tablet, TAKE ONE TABLET BY MOUTH AT BEDTIME AS NEEDED FOR ANXIETY, Disp: 30 tablet, Rfl: 5 .  aspirin (ASPIRIN 81) 81 MG chewable tablet, Chew by mouth daily., Disp: , Rfl:  .  ibuprofen (ADVIL,MOTRIN) 800 MG tablet, Take by mouth., Disp: , Rfl:  .   Multiple Vitamins-Minerals (CENTRUM SILVER) CHEW, Chew by mouth., Disp: , Rfl:  .  Omega-3 Fatty Acids (FISH OIL) 1000 MG CAPS, Take by mouth., Disp: , Rfl:  .  omeprazole (PRILOSEC) 20 MG capsule, Take 20 mg by mouth daily., Disp: , Rfl:  .  ranitidine (ZANTAC) 150 MG tablet, Take 1 tablet (150 mg total) by mouth 2 (two) times daily., Disp: 60 tablet, Rfl: 12 .  vitamin E (VITAMIN E) 400 UNIT capsule, Take 400 Units by mouth daily., Disp: , Rfl:  Allergies: Sertraline hcl  Review of Systems  Constitutional: Negative for chills, fever and malaise/fatigue.  HENT: Negative for congestion, sinus pain and sore throat.   Eyes: Negative for blurred vision and pain.  Respiratory: Negative for cough and wheezing.   Cardiovascular: Negative for chest pain and leg swelling.  Gastrointestinal: Negative for abdominal pain, constipation, diarrhea, heartburn, nausea and vomiting.  Genitourinary: Negative for dysuria, frequency, hematuria and urgency.  Musculoskeletal: Negative for back pain, joint pain, myalgias and neck pain.  Skin: Negative for itching and rash.  Neurological: Negative for dizziness, tremors and weakness.  Endo/Heme/Allergies: Does not bruise/bleed easily.  Psychiatric/Behavioral: Negative for depression. The patient is not nervous/anxious and does not have insomnia.     Objective: BP 140/80   Pulse 86   Ht 5\' 3"  (  1.6 m)   Wt 206 lb (93.4 kg)   BMI 36.49 kg/m   Filed Weights   12/08/16 1444  Weight: 206 lb (93.4 kg)   Body mass index is 36.49 kg/m. Physical Exam  Constitutional: She is oriented to person, place, and time. She appears well-developed and well-nourished. No distress.  Genitourinary: Rectum normal, vagina normal and uterus normal. Pelvic exam was performed with patient supine. There is no rash or lesion on the right labia. There is no rash or lesion on the left labia. Vagina exhibits no lesion. No bleeding in the vagina. Right adnexum does not display mass and  does not display tenderness. Left adnexum does not display mass and does not display tenderness. Cervix does not exhibit motion tenderness, lesion, friability or polyp.   Uterus is mobile and midaxial. Uterus is not enlarged or exhibiting a mass.  HENT:  Head: Normocephalic and atraumatic. Head is without laceration.  Right Ear: Hearing normal.  Left Ear: Hearing normal.  Nose: No epistaxis.  No foreign bodies.  Mouth/Throat: Uvula is midline, oropharynx is clear and moist and mucous membranes are normal.  Eyes: Pupils are equal, round, and reactive to light.  Neck: Normal range of motion. Neck supple. No thyromegaly present.  Cardiovascular: Normal rate and regular rhythm. Exam reveals no gallop and no friction rub.  No murmur heard. Pulmonary/Chest: Effort normal and breath sounds normal. No respiratory distress. She has no wheezes. Right breast exhibits no mass, no skin change and no tenderness. Left breast exhibits no mass, no skin change and no tenderness.  Abdominal: Soft. Bowel sounds are normal. She exhibits no distension. There is no tenderness. There is no rebound.  Musculoskeletal: Normal range of motion.  Neurological: She is alert and oriented to person, place, and time. No cranial nerve deficit.  Skin: Skin is warm and dry.  Psychiatric: She has a normal mood and affect. Judgment normal.  Vitals reviewed.   Assessment: Annual Exam 1. Annual physical exam   2. Screening for breast cancer   3. Screen for colon cancer     Plan:            1.  Cervical Screening-  Pap smear schedule reviewed with patient  2. Breast screening- Exam annually and mammogram scheduled  3. Colonoscopy every 10 years, Hemoccult testing after age 67  4. Labs managed by PCP  5. Counseling for hormonal therapy: none, no change in therapy today     F/U  Return in about 1 year (around 12/08/2017) for Annual.  Barnett Applebaum, MD, [facility], [facility] 12/08/2016   3:06 PM

## 2016-12-08 NOTE — Patient Instructions (Signed)
PAP every three years Mammogram every year    Call [phone] to schedule at [facility] Colonoscopy every 10 years Labs yearly (with PCP) 

## 2016-12-29 ENCOUNTER — Ambulatory Visit
Admission: RE | Admit: 2016-12-29 | Discharge: 2016-12-29 | Disposition: A | Discharge status: Home / Self Care | Payer: Commercial Managed Care - PPO | Source: Ambulatory Visit | Attending: Obstetrics & Gynecology | Admitting: Obstetrics & Gynecology

## 2016-12-29 DIAGNOSIS — Z1231 Encounter for screening mammogram for malignant neoplasm of breast: Secondary | ICD-10-CM | POA: Diagnosis not present

## 2016-12-29 DIAGNOSIS — Z1239 Encounter for other screening for malignant neoplasm of breast: Secondary | ICD-10-CM

## 2017-01-28 LAB — FECAL OCCULT BLOOD, IMMUNOCHEMICAL: Fecal Occult Bld: NEGATIVE

## 2017-01-31 ENCOUNTER — Encounter: Payer: Self-pay | Admitting: Physician Assistant

## 2017-01-31 ENCOUNTER — Ambulatory Visit: Payer: Commercial Managed Care - PPO | Admitting: Physician Assistant

## 2017-01-31 VITALS — BP 132/80 | HR 84 | Temp 98.5°F | Resp 16 | Wt 203.0 lb

## 2017-01-31 DIAGNOSIS — R110 Nausea: Secondary | ICD-10-CM

## 2017-01-31 DIAGNOSIS — J029 Acute pharyngitis, unspecified: Secondary | ICD-10-CM

## 2017-01-31 DIAGNOSIS — H9203 Otalgia, bilateral: Secondary | ICD-10-CM

## 2017-01-31 DIAGNOSIS — R0981 Nasal congestion: Secondary | ICD-10-CM | POA: Diagnosis not present

## 2017-01-31 DIAGNOSIS — R69 Illness, unspecified: Principal | ICD-10-CM

## 2017-01-31 DIAGNOSIS — R05 Cough: Secondary | ICD-10-CM

## 2017-01-31 DIAGNOSIS — R509 Fever, unspecified: Secondary | ICD-10-CM

## 2017-01-31 DIAGNOSIS — R0982 Postnasal drip: Secondary | ICD-10-CM

## 2017-01-31 DIAGNOSIS — J111 Influenza due to unidentified influenza virus with other respiratory manifestations: Secondary | ICD-10-CM

## 2017-01-31 LAB — POCT INFLUENZA A/B
Influenza A, POC: NEGATIVE
Influenza B, POC: NEGATIVE

## 2017-01-31 LAB — POCT RAPID STREP A (OFFICE): Rapid Strep A Screen: NEGATIVE

## 2017-01-31 NOTE — Patient Instructions (Signed)
Upper Respiratory Infection, Adult Most upper respiratory infections (URIs) are caused by a virus. A URI affects the nose, throat, and upper air passages. The most common type of URI is often called "the common cold." Follow these instructions at home:  Take medicines only as told by your doctor.  Gargle warm saltwater or take cough drops to comfort your throat as told by your doctor.  Use a warm mist humidifier or inhale steam from a shower to increase air moisture. This may make it easier to breathe.  Drink enough fluid to keep your pee (urine) clear or pale yellow.  Eat soups and other clear broths.  Have a healthy diet.  Rest as needed.  Go back to work when your fever is gone or your doctor says it is okay. ? You may need to stay home longer to avoid giving your URI to others. ? You can also wear a face mask and wash your hands often to prevent spread of the virus.  Use your inhaler more if you have asthma.  Do not use any tobacco products, including cigarettes, chewing tobacco, or electronic cigarettes. If you need help quitting, ask your doctor. Contact a doctor if:  You are getting worse, not better.  Your symptoms are not helped by medicine.  You have chills.  You are getting more short of breath.  You have brown or red mucus.  You have yellow or brown discharge from your nose.  You have pain in your face, especially when you bend forward.  You have a fever.  You have puffy (swollen) neck glands.  You have pain while swallowing.  You have white areas in the back of your throat. Get help right away if:  You have very bad or constant: ? Headache. ? Ear pain. ? Pain in your forehead, behind your eyes, and over your cheekbones (sinus pain). ? Chest pain.  You have long-lasting (chronic) lung disease and any of the following: ? Wheezing. ? Long-lasting cough. ? Coughing up blood. ? A change in your usual mucus.  You have a stiff neck.  You have  changes in your: ? Vision. ? Hearing. ? Thinking. ? Mood. This information is not intended to replace advice given to you by your health care provider. Make sure you discuss any questions you have with your health care provider. Document Released: 06/14/2007 Document Revised: 08/29/2015 Document Reviewed: 04/02/2013 Elsevier Interactive Patient Education  2018 Elsevier Inc.  

## 2017-01-31 NOTE — Progress Notes (Signed)
[place]  No chief complaint on file.   Subjective:    Patient ID: Danielle Davenport, female    DOB: 16-May-1955, 62 y.o.   MRN: [id number]  Upper Respiratory Infection: Danielle Davenport Danielle Davenport is a 62 y.o. female with a past medical history significant for exposure to strep throat,  complaining of symptoms of a URI. Symptoms include bilateral ear pain, congestion, cough and sore throat. Onset of symptoms was 5 days ago, gradually worsening since that time. She also c/o achiness, congestion, cough described as productive, low grade fever, nausea without vomiting and post nasal drip for the past 5 days .  She is drinking plenty of fluids. Evaluation to date: none. Treatment to date: cough suppressants. The treatment has provided minimal.   Review of Systems  Constitutional: Positive for chills, fatigue and fever (Temp of 100 yesterday). Negative for activity change, appetite change, diaphoresis and unexpected weight change.  HENT: Positive for ear pain, postnasal drip, rhinorrhea, sinus pressure, sinus pain and sore throat. Negative for congestion, ear discharge and tinnitus.   Eyes: Positive for discharge. Negative for photophobia, pain, redness, itching and visual disturbance.  Respiratory: Positive for cough and shortness of breath. Negative for apnea, choking, chest tightness, wheezing and stridor.   Gastrointestinal: Positive for nausea. Negative for abdominal distention, abdominal pain, anal bleeding, blood in stool, constipation, diarrhea, rectal pain and vomiting.  Musculoskeletal: Positive for myalgias. Negative for neck pain and neck stiffness.  Neurological: Positive for light-headedness and headaches. Negative for dizziness.  Hematological: Negative for adenopathy.       Objective:   There were no vitals taken for this visit.  Patient Active Problem List   Diagnosis Date Noted  . Leg pain 04/03/2016  . Chronic venous  insufficiency 04/03/2016  . Lymphedema 04/03/2016  . PAD (peripheral artery disease) ([place]) 04/03/2016  . Colon polyp 05/13/2014  . Adiposity 05/13/2014  . Gastroduodenal ulcer 05/13/2014  . DD (diverticular disease) 05/13/2014    Outpatient Encounter Medications as of 01/31/2017  Medication Sig Note  . acetaminophen (TYLENOL) 325 MG tablet Take by mouth. 05/13/2014: Medication taken as needed.  Received from: [facility]  . ALPRAZolam (XANAX) 0.25 MG tablet TAKE ONE TABLET BY MOUTH AT BEDTIME AS NEEDED FOR ANXIETY   . aspirin (ASPIRIN 81) 81 MG chewable tablet Chew by mouth daily.   Marland Kitchen ibuprofen (ADVIL,MOTRIN) 800 MG tablet Take by mouth. 05/13/2014: Received from: [facility]  . Multiple Vitamins-Minerals (CENTRUM SILVER) CHEW Chew by mouth. 05/13/2014: Received from: [facility]  . Omega-3 Fatty Acids (FISH OIL) 1000 MG CAPS Take by mouth. 05/13/2014: Received from: [facility]  . omeprazole (PRILOSEC) 20 MG capsule Take 20 mg by mouth daily.   . ranitidine (ZANTAC) 150 MG tablet Take 1 tablet (150 mg total) by mouth 2 (two) times daily.   . vitamin E (VITAMIN E) 400 UNIT capsule Take 400 Units by mouth daily.    No facility-administered encounter medications on file as of 01/31/2017.     Allergies  Allergen Reactions  . Sertraline Hcl     uncontrollable bowels, grinding and clinching teeth.       Physical Exam  Constitutional: She is oriented to person, place, and time. She appears well-developed and well-nourished.  HENT:  Right Ear: External ear normal.  Left Ear: External ear normal.  Mouth/Throat: Posterior oropharyngeal edema and posterior oropharyngeal erythema present. No oropharyngeal exudate.  Eyes: Right eye exhibits discharge. Left eye exhibits  discharge.  Water discharge from eyes.  Neck: Neck supple.  Cardiovascular: Normal rate and regular rhythm.  Pulmonary/Chest: Effort normal and breath sounds  normal.  Lymphadenopathy:    She has cervical adenopathy.  Neurological: She is alert and oriented to person, place, and time.  Skin: Skin is warm and dry.  Psychiatric: She has a normal mood and affect. Her behavior is normal.       Assessment & Plan:  1. Influenza-like illness  Rapid strep and flu both negative. Counseled on OTC symptomatic relief including delsym, tylenol, etc. Work note provided.   - POCT rapid strep A - POCT Influenza A/B  Return if symptoms worsen or fail to improve.  The entirety of the information documented in the History of Present Illness, Review of Systems and Physical Exam were personally obtained by me. Portions of this information were initially documented by Ashley Royalty, CMA and reviewed by me for thoroughness and accuracy.    The entirety of the information documented in the History of Present Illness, Review of Systems and Physical Exam were personally obtained by me. Portions of this information were initially documented by Ashley Royalty, CMA and reviewed by me for thoroughness and accuracy.

## 2017-03-01 ENCOUNTER — Ambulatory Visit: Payer: Commercial Managed Care - PPO | Admitting: Family Medicine

## 2017-03-22 ENCOUNTER — Ambulatory Visit: Payer: Commercial Managed Care - PPO | Admitting: Family Medicine

## 2017-03-26 ENCOUNTER — Ambulatory Visit (INDEPENDENT_AMBULATORY_CARE_PROVIDER_SITE_OTHER): Payer: Commercial Managed Care - PPO | Admitting: Family Medicine

## 2017-03-26 ENCOUNTER — Other Ambulatory Visit: Payer: Self-pay

## 2017-03-26 VITALS — BP 130/80 | HR 70 | Temp 97.9°F | Resp 16

## 2017-03-26 DIAGNOSIS — E039 Hypothyroidism, unspecified: Secondary | ICD-10-CM

## 2017-03-26 DIAGNOSIS — F3342 Major depressive disorder, recurrent, in full remission: Secondary | ICD-10-CM

## 2017-03-26 DIAGNOSIS — F419 Anxiety disorder, unspecified: Secondary | ICD-10-CM

## 2017-03-26 MED ORDER — ALPRAZOLAM 0.25 MG PO TABS: 0.2500 mg | ORAL_TABLET | Freq: Every evening | ORAL | 5 refills | Status: DC | PRN

## 2017-03-26 NOTE — Progress Notes (Signed)
Danielle Davenport [name]  MRN: [id number] DOB: [birth year]  Subjective:  HPI   The patient is a 62 year old female who presents for follow up on chronic issues.  She was last seen for this on 08/29/16.  Hypothyroidism-Last labs were on 08/29/16 and it was almost back to normal.  Depression-On her last visit the patient reported being stable off of medicine, as she did not tolerate the Sertraline.  She continues to do well  Off medicine and states that the Xanax helps when she does have trouble and is only taking if on a rare occasion.  According to the patient she will need refill as her prescription expired before using the refills. Overall she feel well.  Depression screen [facility] 2/9 03/26/2017 06/28/2016 06/06/2016 12/08/2015 10/06/2015  Decreased Interest 0 0 1 1 2   Down, Depressed, Hopeless 1 1 1 1 1   PHQ - 2 Score 1 1 2 2 3   Altered sleeping 0 0 0 2 3  Tired, decreased energy 0 0 1 1 3   Change in appetite 1 1 0 1 1  Feeling bad or failure about yourself  0 0 1 0 1  Trouble concentrating 0 0 0 0 1  Moving slowly or fidgety/restless 0 1 0 1 1  Suicidal thoughts 0 0 0 0 0  PHQ-9 Score 2 3 4 7 13   Difficult doing work/chores Not difficult at all - - Somewhat difficult -     Patient Active Problem List   Diagnosis Date Noted  . Leg pain 04/03/2016  . Chronic venous insufficiency 04/03/2016  . Lymphedema 04/03/2016  . PAD (peripheral artery disease) ([place]) 04/03/2016  . Colon polyp 05/13/2014  . Adiposity 05/13/2014  . Gastroduodenal ulcer 05/13/2014  . DD (diverticular disease) 05/13/2014    Past Medical History:  Diagnosis Date  . Colon polyp   . Depression   . Diverticulosis   . Gastroduodenal ulcer   . GERD (gastroesophageal reflux disease)     Social History   Socioeconomic History  . Marital status: Married    Spouse name: Danielle Davenport  . Number of children: 2  . Years of education: HS  . Highest education level: Not on file  Social Needs  . Financial resource  strain: Not on file  . Food insecurity - worry: Not on file  . Food insecurity - inability: Not on file  . Transportation needs - medical: Not on file  . Transportation needs - non-medical: Not on file  Occupational History  . Occupation:       WORK    Employer: [employer]  Tobacco Use  . Smoking status: Never Smoker  . Smokeless tobacco: Never Used  Substance and Sexual Activity  . Alcohol use: No    Alcohol/week: 0.0 oz  . Drug use: No  . Sexual activity: Yes    Birth control/protection: Post-menopausal  Other Topics Concern  . Not on file  Social History Narrative  . Not on file    Outpatient Encounter Medications as of 03/26/2017  Medication Sig Note  . acetaminophen (TYLENOL) 325 MG tablet Take by mouth. 05/13/2014: Medication taken as needed.  Received from: [facility]  . ALPRAZolam (XANAX) 0.25 MG tablet TAKE ONE TABLET BY MOUTH AT BEDTIME AS NEEDED FOR ANXIETY   . aspirin (ASPIRIN 81) 81 MG chewable tablet Chew by mouth daily.   Marland Kitchen ibuprofen (ADVIL,MOTRIN) 800 MG tablet Take by mouth. 05/13/2014: Received from: [facility]  . Multiple Vitamins-Minerals (CENTRUM SILVER) CHEW  Chew by mouth. 05/13/2014: Received from: [facility]  . Omega-3 Fatty Acids (FISH OIL) 1000 MG CAPS Take by mouth. 05/13/2014: Received from: [facility]  . omeprazole (PRILOSEC) 20 MG capsule Take 20 mg by mouth daily.   . ranitidine (ZANTAC) 150 MG tablet Take 1 tablet (150 mg total) by mouth 2 (two) times daily.   . vitamin E (VITAMIN E) 400 UNIT capsule Take 400 Units by mouth daily.    No facility-administered encounter medications on file as of 03/26/2017.     Allergies  Allergen Reactions  . Sertraline Hcl     uncontrollable bowels, grinding and clinching teeth.    Review of Systems  Constitutional: Negative.   Eyes: Negative.   Respiratory: Negative.   Gastrointestinal: Negative.   Skin: Negative.   Neurological:  Negative.   Endo/Heme/Allergies: Negative.   Psychiatric/Behavioral: Negative.     Objective:  BP 130/80 (BP Location: Right Arm, Patient Position: Sitting, Cuff Size: Normal)   Pulse 70   Temp 97.9 F (36.6 C) (Oral)   Resp 16   Physical Exam  Constitutional: She is oriented to person, place, and time and well-developed, well-nourished, and in no distress.  HENT:  Head: Normocephalic and atraumatic.  Right Ear: External ear normal.  Left Ear: External ear normal.  Nose: Nose normal.  Eyes: Conjunctivae are normal. Pupils are equal, round, and reactive to light. No scleral icterus.  Neck: Normal range of motion. No thyromegaly present.  Cardiovascular: Normal rate, regular rhythm and normal heart sounds.  Pulmonary/Chest: Effort normal and breath sounds normal.  Musculoskeletal: She exhibits edema (trace).  Neurological: She is alert and oriented to person, place, and time. Gait normal. GCS score is 15.  Skin: Skin is warm and dry.  Psychiatric: Mood, memory, affect and judgment normal.    Assessment and Plan :  1. Hypothyroidism, unspecified type  - TSH  2. Anxiety  - ALPRAZolam (XANAX) 0.25 MG tablet; Take 1 tablet (0.25 mg total) by mouth at bedtime as needed for anxiety.  Dispense: 30 tablet; Refill: 5  3. Depression, major, recurrent, in complete remission ([place])  I have done the exam and reviewed the chart and it is accurate to the best of my knowledge. Development worker, community has been used and  any errors in dictation or transcription are unintentional. Miguel Aschoff M.D. [facility]

## 2017-03-27 LAB — TSH: TSH: 5.68 u[IU]/mL — ABNORMAL HIGH (ref 0.450–4.500)

## 2017-03-28 ENCOUNTER — Telehealth: Payer: Self-pay

## 2017-03-28 NOTE — Telephone Encounter (Signed)
-----   Message from Jerrol Banana., MD sent at 03/28/2017  2:20 PM EDT ----- A little lower--is pt ready to start synthroid.?--would try 25 mcg if she is.

## 2017-03-28 NOTE — Telephone Encounter (Signed)
[id number] 

## 2017-03-29 MED ORDER — LEVOTHYROXINE SODIUM 25 MCG PO TABS: 25.0000 ug | ORAL_TABLET | Freq: Every day | ORAL | 11 refills | Status: DC

## 2017-03-29 NOTE — Telephone Encounter (Signed)
Patient advised she is compliant with starting medicaton. Im assuming your prescribing Levothyroxine? If so what is number of qty and number of refills you would like sent in? Thanks [employer]

## 2017-03-29 NOTE — Telephone Encounter (Signed)
#  [phone].

## 2017-07-03 ENCOUNTER — Encounter: Payer: Self-pay | Admitting: Family Medicine

## 2017-08-13 ENCOUNTER — Telehealth: Payer: Self-pay

## 2017-08-13 NOTE — Telephone Encounter (Signed)
Pt called triage line needing to see [place] her right breast has pain with clear discharge. Pt aware RPH out of office we will schedule her with another provider

## 2017-08-17 ENCOUNTER — Ambulatory Visit (INDEPENDENT_AMBULATORY_CARE_PROVIDER_SITE_OTHER): Payer: Commercial Managed Care - PPO | Admitting: Obstetrics and Gynecology

## 2017-08-17 ENCOUNTER — Encounter: Payer: Self-pay | Admitting: Obstetrics and Gynecology

## 2017-08-17 ENCOUNTER — Other Ambulatory Visit (HOSPITAL_COMMUNITY)
Admission: RE | Admit: 2017-08-17 | Discharge: 2017-08-17 | Disposition: A | Discharge status: Home / Self Care | Payer: Commercial Managed Care - PPO | Source: Ambulatory Visit | Attending: Obstetrics and Gynecology | Admitting: Obstetrics and Gynecology

## 2017-08-17 VITALS — BP 136/80 | HR 90 | Ht 64.0 in | Wt 206.0 lb

## 2017-08-17 DIAGNOSIS — E039 Hypothyroidism, unspecified: Secondary | ICD-10-CM | POA: Diagnosis not present

## 2017-08-17 DIAGNOSIS — N644 Mastodynia: Secondary | ICD-10-CM | POA: Insufficient documentation

## 2017-08-17 DIAGNOSIS — K219 Gastro-esophageal reflux disease without esophagitis: Secondary | ICD-10-CM | POA: Diagnosis not present

## 2017-08-17 DIAGNOSIS — N6452 Nipple discharge: Secondary | ICD-10-CM

## 2017-08-17 DIAGNOSIS — Z1371 Encounter for nonprocreative screening for genetic disease carrier status: Secondary | ICD-10-CM | POA: Diagnosis not present

## 2017-08-17 DIAGNOSIS — Z79890 Hormone replacement therapy (postmenopausal): Secondary | ICD-10-CM | POA: Diagnosis not present

## 2017-08-17 DIAGNOSIS — Z79899 Other long term (current) drug therapy: Secondary | ICD-10-CM | POA: Diagnosis not present

## 2017-08-17 DIAGNOSIS — Z7982 Long term (current) use of aspirin: Secondary | ICD-10-CM | POA: Insufficient documentation

## 2017-08-17 DIAGNOSIS — F329 Major depressive disorder, single episode, unspecified: Secondary | ICD-10-CM | POA: Diagnosis not present

## 2017-08-17 NOTE — Progress Notes (Signed)
Obstetrics & Gynecology Office Visit   Chief Complaint:  Chief Complaint  Patient presents with  . Right breast discharge    pain in right breast x1 week    History of Present Illness: 62 year old caucasian female presenting for evaluation of right sided nipple discharge and ipsilateral mastalgia.  Denies trauma or inciting event.  Discharge is described as clear, not milky, blood tinged or green.  No fevers, no chills.  She denies headaches or vision changes.  Does have a personal history of hypothyroidism on levothyroxine.  She is on an H-2 blocker but this is longstanding.  The patient additionally has undergone BRCA testing previously secondary to a maternal history of breast cancer and maternal aunt with breast cancer, that testing was negative.  She has had a lumpectomy of the left breast (contralateral to current symptoms)  TC model lifetime risk of breast cancer 22.3%     Review of Systems: Review of Systems  Constitutional: Negative.   Gastrointestinal: Negative.   Skin: Negative.      Past Medical History:  Past Medical History:  Diagnosis Date  . Colon polyp   . Depression   . Diverticulosis   . Gastroduodenal ulcer   . GERD (gastroesophageal reflux disease)   . Thyroid disease    Hypothyroid    Past Surgical History:  Past Surgical History:  Procedure Laterality Date  . BREAST BIOPSY Left 2000   benign  . INCONTINENCE SURGERY    . TUBAL LIGATION    . UTERINE FIBROID SURGERY      Gynecologic History: No LMP recorded. Patient is postmenopausal.  Obstetric History: [id number]  Family History:  Family History  Problem Relation Age of Onset  . Breast cancer Mother 10  . Cirrhosis Father   . COPD Brother   . COPD Daughter   . Emphysema Maternal Grandfather   . Stroke Paternal Grandfather     Social History:  Social History   Socioeconomic History  . Marital status: Married    Spouse name: Jori Moll  . Number of children: 2  . Years of  education: HS  . Highest education level: Not on file  Occupational History  . Occupation:       WORK    Employer: [employer]  . Financial resource strain: Not on file  . Food insecurity:    Worry: Not on file    Inability: Not on file  . Transportation needs:    Medical: Not on file    Non-medical: Not on file  Tobacco Use  . Smoking status: Never Smoker  . Smokeless tobacco: Never Used  Substance and Sexual Activity  . Alcohol use: No    Alcohol/week: 0.0 standard drinks  . Drug use: No  . Sexual activity: Yes    Birth control/protection: Post-menopausal  Lifestyle  . Physical activity:    Days per week: Not on file    Minutes per session: Not on file  . Stress: Not on file  Relationships  . Social connections:    Talks on phone: Not on file    Gets together: Not on file    Attends religious service: Not on file    Active member of club or organization: Not on file    Attends meetings of clubs or organizations: Not on file    Relationship status: Not on file  . Intimate partner violence:    Fear of current or ex partner: Not on file  Emotionally abused: Not on file    Physically abused: Not on file    Forced sexual activity: Not on file  Other Topics Concern  . Not on file  Social History Narrative  . Not on file    Allergies:  Allergies  Allergen Reactions  . Sertraline Hcl     uncontrollable bowels, grinding and clinching teeth.    Medications: Prior to Admission medications   Medication Sig Start Date End Date Taking? Authorizing Provider  acetaminophen (TYLENOL) 325 MG tablet Take by mouth.   Yes [provider]  ALPRAZolam (XANAX) 0.25 MG tablet Take 1 tablet (0.25 mg total) by mouth at bedtime as needed for anxiety. 03/26/17  Yes Jerrol Banana., MD  aspirin (ASPIRIN 81) 81 MG chewable tablet Chew by mouth daily.   Yes [provider]  ibuprofen (ADVIL,MOTRIN) 800 MG tablet Take by mouth.   Yes [provider]  levothyroxine (SYNTHROID, LEVOTHROID) 25 MCG tablet Take 1 tablet (25 mcg total) by mouth daily before breakfast. 03/29/17  Yes Jerrol Banana., MD  Multiple Vitamins-Minerals (CENTRUM SILVER) CHEW Chew by mouth.   Yes [provider]  Omega-3 Fatty Acids (FISH OIL) 1000 MG CAPS Take by mouth.   Yes [provider]  omeprazole (PRILOSEC) 20 MG capsule Take 20 mg by mouth daily.   Yes [provider]  ranitidine (ZANTAC) 150 MG tablet Take 1 tablet (150 mg total) by mouth 2 (two) times daily. 06/25/14  Yes Jerrol Banana., MD  vitamin E (VITAMIN E) 400 UNIT capsule Take 400 Units by mouth daily.   Yes [provider]    Physical Exam Vitals:  Vitals:   08/17/17 0809  BP: 136/80  Pulse: 90   No LMP recorded. Patient is postmenopausal.  General: NAD HEENT: normocephalic, anicteric Pulmonary: No increased work of breathing Breast symmetrical, no tenderness, no palpable nodules or masses, no skin or nipple retraction present, no nipple discharge.  No axillary or supraclavicular lymphadenopathy.  The patient is able to express about two small drops of clear discharge from the right nipple which was collected and sent for cytology Extremities: no edema, erythema, or tenderness Neurologic: Grossly intact Psychiatric: mood appropriate, affect full  Female chaperone present for pelvic  portions of the physical exam  Assessment: 63 y.o. [id number] with unilateral right nipple discharge and mastalgia   Plan: Problem List Items Addressed This Visit    None    Visit Diagnoses    Mastodynia of right breast    -  Primary   Relevant Orders   Cytology - non gyn   Prolactin   Thyroid Panel With TSH   US BREAST LTD UNI RIGHT INC AXILLA   MM DIAG BREAST TOMO UNI RIGHT   Discharge from right nipple       Relevant Orders   Cytology - non gyn   Prolactin   Thyroid Panel With TSH   US BREAST LTD UNI RIGHT INC AXILLA   MM DIAG BREAST  TOMO UNI RIGHT      1) Unilateral right nipple discharge - clear so not consistent with intraductal papilloma.  Glactorrhea caused by hormonal causes is generally bilateral.  Her most recent TSH in March of 2019 was elevated.  TRH elevation may also increase serum prolactin prolactinlevels.  Medication reviewed she is on an H2 blocker which may also increase serum prolactin level but she has been on this for sometime. - Thyroid panel and prolactin today - discharge  collected today for cytology - diagnostic mammogram and ultrasound right breast (Mammogram 12/29/2016 [facility])  2) A total of 15 minutes were spent in face-to-face contact with the patient during this encounter with over half of that time devoted to counseling and coordination of care.  3) Return if symptoms worsen or fail to improve.   Malachy Mood, MD, [facility], [facility] 08/17/2017, 1:41 PM

## 2017-08-18 LAB — THYROID PANEL WITH TSH
Free Thyroxine Index: 1.4 (ref 1.2–4.9)
T3 Uptake Ratio: 20 % — ABNORMAL LOW (ref 24–39)
T4, Total: 6.8 ug/dL (ref 4.5–12.0)
TSH: 6.91 u[IU]/mL — ABNORMAL HIGH (ref 0.450–4.500)

## 2017-08-18 LAB — PROLACTIN: PROLACTIN: 5.7 ng/mL (ref 4.8–23.3)

## 2017-08-20 ENCOUNTER — Encounter: Payer: Self-pay | Admitting: Family Medicine

## 2017-08-20 ENCOUNTER — Other Ambulatory Visit: Payer: Self-pay | Admitting: Obstetrics and Gynecology

## 2017-08-20 MED ORDER — LEVOTHYROXINE SODIUM 50 MCG PO TABS: 25.0000 ug | ORAL_TABLET | Freq: Every day | ORAL | 11 refills | Status: DC

## 2017-08-21 ENCOUNTER — Ambulatory Visit
Admission: RE | Admit: 2017-08-21 | Discharge: 2017-08-21 | Disposition: A | Discharge status: Home / Self Care | Payer: Commercial Managed Care - PPO | Source: Ambulatory Visit | Attending: Obstetrics and Gynecology | Admitting: Obstetrics and Gynecology

## 2017-08-21 DIAGNOSIS — N6452 Nipple discharge: Secondary | ICD-10-CM

## 2017-08-21 DIAGNOSIS — N644 Mastodynia: Secondary | ICD-10-CM

## 2017-09-18 ENCOUNTER — Ambulatory Visit (INDEPENDENT_AMBULATORY_CARE_PROVIDER_SITE_OTHER): Payer: Commercial Managed Care - PPO | Admitting: Family Medicine

## 2017-09-18 ENCOUNTER — Encounter: Payer: Self-pay | Admitting: Family Medicine

## 2017-09-18 VITALS — BP 132/68 | HR 74 | Temp 98.5°F | Resp 16 | Ht 63.5 in | Wt 203.0 lb

## 2017-09-18 DIAGNOSIS — E7800 Pure hypercholesterolemia, unspecified: Secondary | ICD-10-CM | POA: Diagnosis not present

## 2017-09-18 DIAGNOSIS — E039 Hypothyroidism, unspecified: Secondary | ICD-10-CM

## 2017-09-18 DIAGNOSIS — Z0000 Encounter for general adult medical examination without abnormal findings: Secondary | ICD-10-CM

## 2017-09-18 NOTE — Progress Notes (Signed)
Patient: Danielle Davenport, Female    DOB: November 18, 1955, 62 y.o.   MRN: [id number] Visit Date: 09/18/2017  Today's Provider: Wilhemena Durie, MD   Chief Complaint  Patient presents with  . Annual Exam   Subjective:    Annual physical exam Danielle Davenport Danielle Davenport is a 62 y.o. female who presents today for health maintenance and complete physical. She feels well. She reports exercising not regularly, but she does stay active. She reports she is sleeping fairly well.  Colonoscopy- 07/28/2009. Hemorrhoids. Repeat in 58yr.  Tdap- 12/09/2013. Pap- 09/01/2015. Normal. F/B GYN. Mammogram- Mammogram and Breast [facility] on 08/21/2017.  Due for bilateral screening in 12/2017.      Review of Systems  Constitutional: Positive for fatigue.  HENT: Negative.   Eyes: Positive for redness.  Respiratory: Negative.   Cardiovascular: Negative.   Gastrointestinal: Negative.   Endocrine: Negative.   Genitourinary: Negative.   Musculoskeletal: Positive for arthralgias and back pain.  Skin: Negative.   Allergic/Immunologic: Negative.   Neurological: Positive for light-headedness.  Hematological: Negative.   Psychiatric/Behavioral: Negative.     Social History      She  reports that she has never smoked. She has never used smokeless tobacco. She reports that she does not drink alcohol or use drugs.       Social History   Socioeconomic History  . Marital status: Married    Spouse name: RJori Moll . Number of children: 2  . Years of education: HS  . Highest education level: Not on file  Occupational History  . Occupation:       WORK    Employer: [employer] . Financial resource strain: Not on file  . Food insecurity:    Worry: Not on file    Inability: Not on file  . Transportation needs:    Medical: Not on file    Non-medical: Not on file  Tobacco Use  . Smoking status: Never Smoker  . Smokeless tobacco: Never Used  Substance and Sexual Activity  .  Alcohol use: No    Alcohol/week: 0.0 standard drinks  . Drug use: No  . Sexual activity: Yes    Birth control/protection: Post-menopausal  Lifestyle  . Physical activity:    Days per week: Not on file    Minutes per session: Not on file  . Stress: Not on file  Relationships  . Social connections:    Talks on phone: Not on file    Gets together: Not on file    Attends religious service: Not on file    Active member of club or organization: Not on file    Attends meetings of clubs or organizations: Not on file    Relationship status: Not on file  Other Topics Concern  . Not on file  Social History Narrative  . Not on file    Past Medical History:  Diagnosis Date  . Colon polyp   . Depression   . Diverticulosis   . Gastroduodenal ulcer   . GERD (gastroesophageal reflux disease)   . Thyroid disease    Hypothyroid     Patient Active Problem List   Diagnosis Date Noted  . BRCA negative 08/17/2017  . Leg pain 04/03/2016  . Chronic venous insufficiency 04/03/2016  . Lymphedema 04/03/2016  . [place] artery disease[place] 04/03/2016  . Colon polyp 05/13/2014  . Adiposity 05/13/2014  . Gastroduodenal ulcer 05/13/2014  . DD (diverticular disease)  05/13/2014    Past Surgical History:  Procedure Laterality Date  . BREAST BIOPSY Left 2000   benign  . INCONTINENCE SURGERY    . TUBAL LIGATION    . UTERINE FIBROID SURGERY      Family History        Family Status  Relation Name Status  . Mother  Deceased  . Father  Deceased  . Brother  Alive  . Daughter  Alive  . Son  Alive  . MGF  (Not Specified)  . PGF  (Not Specified)        Her family history includes Breast cancer (age of onset: 38) in her mother; COPD in her brother and daughter; Cirrhosis in her father; Emphysema in her maternal grandfather; Stroke in her paternal grandfather.      Allergies  Allergen Reactions  . Sertraline Hcl     uncontrollable bowels, grinding and clinching teeth.      Current Outpatient Medications:  .  acetaminophen (TYLENOL) 325 MG tablet, Take by mouth., Disp: , Rfl:  .  ALPRAZolam (XANAX) 0.25 MG tablet, Take 1 tablet (0.25 mg total) by mouth at bedtime as needed for anxiety., Disp: 30 tablet, Rfl: 5 .  aspirin (ASPIRIN 81) 81 MG chewable tablet, Chew by mouth daily., Disp: , Rfl:  .  ibuprofen (ADVIL,MOTRIN) 800 MG tablet, Take by mouth., Disp: , Rfl:  .  levothyroxine (SYNTHROID, LEVOTHROID) 50 MCG tablet, Take 0.5 tablets (25 mcg total) by mouth daily before breakfast., Disp: 30 tablet, Rfl: 11 .  Multiple Vitamins-Minerals (CENTRUM SILVER) CHEW, Chew by mouth., Disp: , Rfl:  .  Omega-3 Fatty Acids (FISH OIL) 1000 MG CAPS, Take by mouth., Disp: , Rfl:  .  omeprazole (PRILOSEC) 20 MG capsule, Take 20 mg by mouth daily., Disp: , Rfl:  .  ranitidine (ZANTAC) 150 MG tablet, Take 1 tablet (150 mg total) by mouth 2 (two) times daily., Disp: 60 tablet, Rfl: 12 .  vitamin E (VITAMIN E) 400 UNIT capsule, Take 400 Units by mouth daily., Disp: , Rfl:    Patient Care Team: Jerrol Banana., MD as PCP - General (Family Medicine)      Objective:   Vitals: BP 132/68 (BP Location: Left Arm, Patient Position: Sitting, Cuff Size: Large)   Pulse 74   Temp 98.5 F (36.9 C)   Resp 16   Ht 5' 3.5" (1.613 m)   Wt 203 lb (92.1 kg)   SpO2 96%   BMI 35.40 kg/m    Vitals:   09/18/17 1008  BP: 132/68  Pulse: 74  Resp: 16  Temp: 98.5 F (36.9 C)  SpO2: 96%  Weight: 203 lb (92.1 kg)  Height: 5' 3.5" (1.613 m)     Physical Exam  Constitutional: She is oriented to person, place, and time. She appears well-developed and well-nourished.  HENT:  Head: Normocephalic and atraumatic.  Right Ear: External ear normal.  Left Ear: External ear normal.  Nose: Nose normal.  Mouth/Throat: Oropharynx is clear and moist.  Eyes: Conjunctivae are normal.  Neck: Normal range of motion.  Cardiovascular: Normal rate, regular rhythm, normal heart sounds and  intact distal pulses.  Pulmonary/Chest: Right breast exhibits nipple discharge. Right breast exhibits no inverted nipple, no mass, no skin change and no tenderness. Left breast exhibits no inverted nipple, no mass, no nipple discharge, no skin change and no tenderness. There is breast tenderness. No breast swelling, discharge or bleeding. Breasts are symmetrical.  Minimal clear drainage in right nipple.  Abdominal: Soft. Bowel sounds are normal.  Musculoskeletal: Normal range of motion.  Neurological: She is alert and oriented to person, place, and time.  Skin: Skin is warm and dry. Capillary refill takes less than 2 seconds.  Psychiatric: She has a normal mood and affect. Her behavior is normal. Judgment and thought content normal.     Depression Screen PHQ 2/9 Scores 09/18/2017 03/26/2017 06/28/2016 06/06/2016  PHQ - 2 Score 2 1 1 2   PHQ- 9 Score 4 2 3 4       Assessment & Plan:     Routine Health Maintenance and Physical Exam  Exercise Activities and Dietary recommendations Goals   None     Immunization History  Administered Date(s) Administered  . Tdap 12/09/2013    Health Maintenance  Topic Date Due  . HIV Screening  05/20/1970  . INFLUENZA VACCINE  08/09/2017  . PAP SMEAR  09/01/2018  . MAMMOGRAM  12/30/2018  . COLONOSCOPY  07/29/2019  . TETANUS/TDAP  12/10/2023  . Hepatitis C Screening  Completed    Breast/pelvic/DRE per Gyn. Colonoscopy 2021. Discussed health benefits of physical activity, and encouraged her to engage in regular exercise appropriate for her age and condition.     I have done the exam and reviewed the above chart and it is accurate to the best of my knowledge. Development worker, community has been used in this note in any air is in the dictation or transcription are unintentional.  Wilhemena Durie, MD  [place]

## 2017-09-19 LAB — LIPID PANEL
CHOLESTEROL TOTAL: 189 mg/dL (ref 100–199)
Chol/HDL Ratio: 3.6 ratio (ref 0.0–4.4)
HDL: 52 mg/dL (ref >39)
LDL CALC: 106 mg/dL — AB (ref 0–99)
TRIGLYCERIDES: 156 mg/dL — AB (ref 0–149)
VLDL Cholesterol Cal: 31 mg/dL (ref 5–40)

## 2017-09-19 LAB — CBC WITH DIFFERENTIAL/PLATELET
BASOS ABS: 0.1 10*3/uL (ref 0.0–0.2)
Basos: 1 %
EOS (ABSOLUTE): 0.2 10*3/uL (ref 0.0–0.4)
Eos: 3 %
HEMATOCRIT: 37.4 % (ref 34.0–46.6)
Hemoglobin: 12.6 g/dL (ref 11.1–15.9)
Immature Grans (Abs): 0 10*3/uL (ref 0.0–0.1)
Immature Granulocytes: 0 %
LYMPHS ABS: 2.5 10*3/uL (ref 0.7–3.1)
Lymphs: 33 %
MCH: 28.6 pg (ref 26.6–33.0)
MCHC: 33.7 g/dL (ref 31.5–35.7)
MCV: 85 fL (ref 79–97)
Monocytes Absolute: 0.7 10*3/uL (ref 0.1–0.9)
Monocytes: 9 %
Neutrophils Absolute: 4.1 10*3/uL (ref 1.4–7.0)
Neutrophils: 54 %
Platelets: 278 10*3/uL (ref 150–450)
RBC: 4.4 x10E6/uL (ref 3.77–5.28)
RDW: 13.2 % (ref 12.3–15.4)
WBC: 7.6 10*3/uL (ref 3.4–10.8)

## 2017-09-19 LAB — COMPREHENSIVE METABOLIC PANEL
ALT: 21 IU/L (ref 0–32)
AST: 18 IU/L (ref 0–40)
Albumin/Globulin Ratio: 2.1 (ref 1.2–2.2)
Albumin: 4.7 g/dL (ref 3.6–4.8)
Alkaline Phosphatase: 109 IU/L (ref 39–117)
BILIRUBIN TOTAL: 0.4 mg/dL (ref 0.0–1.2)
BUN/Creatinine Ratio: 19 (ref 12–28)
BUN: 14 mg/dL (ref 8–27)
CHLORIDE: 104 mmol/L (ref 96–106)
CO2: 21 mmol/L (ref 20–29)
CREATININE: 0.75 mg/dL (ref 0.57–1.00)
Calcium: 9.1 mg/dL (ref 8.7–10.3)
GFR calc Af Amer: 99 mL/min/{1.73_m2} (ref >59)
GFR calc non Af Amer: 86 mL/min/{1.73_m2} (ref >59)
GLOBULIN, TOTAL: 2.2 g/dL (ref 1.5–4.5)
GLUCOSE: 84 mg/dL (ref 65–99)
Potassium: 4.1 mmol/L (ref 3.5–5.2)
SODIUM: 142 mmol/L (ref 134–144)
Total Protein: 6.9 g/dL (ref 6.0–8.5)

## 2017-09-19 LAB — TSH: TSH: 3.38 u[IU]/mL (ref 0.450–4.500)

## 2017-09-20 ENCOUNTER — Telehealth: Payer: Self-pay

## 2017-09-20 NOTE — Telephone Encounter (Signed)
-----   Message from Jerrol Banana., MD sent at 09/19/2017  9:21 AM EDT ----- Labs good.

## 2017-09-20 NOTE — Telephone Encounter (Signed)
Tried calling patient, no answer. Will try again later.  

## 2017-09-26 ENCOUNTER — Telehealth: Payer: Self-pay

## 2017-09-26 NOTE — Telephone Encounter (Signed)
error 

## 2017-09-26 NOTE — Telephone Encounter (Signed)
Patient advised as below.  

## 2017-11-14 ENCOUNTER — Telehealth: Payer: Self-pay

## 2017-11-14 NOTE — Telephone Encounter (Signed)
Pt is wanting to know if Dr.Harris can put the order in for her to have her mammogram sense her annual is 12/13/17 please advise.

## 2017-11-15 ENCOUNTER — Other Ambulatory Visit: Payer: Self-pay | Admitting: Obstetrics & Gynecology

## 2017-11-15 DIAGNOSIS — Z1239 Encounter for other screening for malignant neoplasm of breast: Secondary | ICD-10-CM

## 2017-12-13 ENCOUNTER — Encounter: Payer: Self-pay | Admitting: Obstetrics & Gynecology

## 2017-12-13 ENCOUNTER — Ambulatory Visit (INDEPENDENT_AMBULATORY_CARE_PROVIDER_SITE_OTHER): Payer: Commercial Managed Care - PPO | Admitting: Obstetrics & Gynecology

## 2017-12-13 VITALS — BP 124/80 | Ht 63.0 in | Wt 210.0 lb

## 2017-12-13 DIAGNOSIS — Z1211 Encounter for screening for malignant neoplasm of colon: Secondary | ICD-10-CM

## 2017-12-13 DIAGNOSIS — Z01419 Encounter for gynecological examination (general) (routine) without abnormal findings: Secondary | ICD-10-CM | POA: Diagnosis not present

## 2017-12-13 DIAGNOSIS — Z0000 Encounter for general adult medical examination without abnormal findings: Secondary | ICD-10-CM

## 2017-12-13 DIAGNOSIS — Z1371 Encounter for nonprocreative screening for genetic disease carrier status: Secondary | ICD-10-CM

## 2017-12-13 DIAGNOSIS — N644 Mastodynia: Secondary | ICD-10-CM | POA: Insufficient documentation

## 2017-12-13 NOTE — Progress Notes (Signed)
HPI:      [name] is a 62 y.o. [id number] who LMP was in the past, she presents today for her annual examination.  The patient has no complaints today. The patient is sexually active. Herlast pap: approximate date 2017 and was normal and last mammogram: approximate date 2018 and was normal.  She has had breast exam and scan done for mastodynia 2 mos ago and has screening MMG scheduled for 12/23/19No mass.  Has occas galactorrhea, not bloody.  BRCA Neg.  Still has  The patient does perform self breast exams.  There is notable family history of breast or ovarian cancer in her family.   The patient is not taking hormone replacement therapy. Patient denies post-menopausal vaginal bleeding.   The patient has regular exercise: yes. The patient denies current symptoms of depression.    GYN Hx: Last Colonoscopy:4 years ago. Normal.  Last DEXA: never ago.    PMHx: Past Medical History:  Diagnosis Date  . Arthritis   . BRCA negative   . Colon polyp   . Depression   . Diverticulosis   . Functional disorder of stomach   . Gastroduodenal ulcer   . GERD (gastroesophageal reflux disease)   . Liver disorder   . Thyroid disease    Hypothyroid   Past Surgical History:  Procedure Laterality Date  . BREAST BIOPSY Left 2000   benign  . BREAST LUMPECTOMY    . HYSTEROSCOPY    . INCONTINENCE SURGERY    . TUBAL LIGATION    . UTERINE FIBROID SURGERY     Family History  Problem Relation Age of Onset  . Breast cancer Mother 69  . Brain cancer Mother   . Cirrhosis Father   . COPD Brother   . COPD Daughter   . Emphysema Maternal Grandfather   . Stroke Paternal Grandfather   . Brain cancer Sister   . Breast cancer Maternal Aunt   . Stroke Maternal Uncle    Social History   Tobacco Use  . Smoking status: Never Smoker  . Smokeless tobacco: Never Used  Substance Use Topics  . Alcohol use: No    Alcohol/week: 0.0 standard drinks  . Drug use: No    Current Outpatient  Medications:  .  acetaminophen (TYLENOL) 325 MG tablet, Take by mouth., Disp: , Rfl:  .  ALPRAZolam (XANAX) 0.25 MG tablet, Take 1 tablet (0.25 mg total) by mouth at bedtime as needed for anxiety., Disp: 30 tablet, Rfl: 5 .  aspirin (ASPIRIN 81) 81 MG chewable tablet, Chew by mouth daily., Disp: , Rfl:  .  ibuprofen (ADVIL,MOTRIN) 800 MG tablet, Take by mouth., Disp: , Rfl:  .  levothyroxine (SYNTHROID, LEVOTHROID) 50 MCG tablet, Take 0.5 tablets (25 mcg total) by mouth daily before breakfast., Disp: 30 tablet, Rfl: 11 .  Multiple Vitamins-Minerals (CENTRUM SILVER) CHEW, Chew by mouth., Disp: , Rfl:  .  Omega-3 Fatty Acids (FISH OIL) 1000 MG CAPS, Take by mouth., Disp: , Rfl:  .  omeprazole (PRILOSEC) 20 MG capsule, Take 20 mg by mouth daily., Disp: , Rfl:  .  ranitidine (ZANTAC) 150 MG tablet, Take 1 tablet (150 mg total) by mouth 2 (two) times daily., Disp: 60 tablet, Rfl: 12 .  vitamin E (VITAMIN E) 400 UNIT capsule, Take 400 Units by mouth daily., Disp: , Rfl:  Allergies: Sertraline hcl  Review of Systems  Constitutional: Negative for chills, fever and malaise/fatigue.  HENT: Negative for congestion, sinus pain and sore throat.  Eyes: Negative for blurred vision and pain.  Respiratory: Negative for cough and wheezing.   Cardiovascular: Negative for chest pain and leg swelling.  Gastrointestinal: Negative for abdominal pain, constipation, diarrhea, heartburn, nausea and vomiting.  Genitourinary: Negative for dysuria, frequency, hematuria and urgency.  Musculoskeletal: Negative for back pain, joint pain, myalgias and neck pain.  Skin: Negative for itching and rash.  Neurological: Negative for dizziness, tremors and weakness.  Endo/Heme/Allergies: Does not bruise/bleed easily.  Psychiatric/Behavioral: Negative for depression. The patient is not nervous/anxious and does not have insomnia.     Objective: BP 124/80   Ht 5' 3"  (1.6 m)   Wt 210 lb (95.3 kg)   BMI 37.20 kg/m   Filed  Weights   12/13/17 1424  Weight: 210 lb (95.3 kg)   Body mass index is 37.2 kg/m. Physical Exam  Constitutional: She is oriented to person, place, and time. She appears well-developed and well-nourished. No distress.  Genitourinary: Rectum normal, vagina normal and uterus normal. Pelvic exam was performed with patient supine. There is no rash or lesion on the right labia. There is no rash or lesion on the left labia. Vagina exhibits no lesion. No bleeding in the vagina. Right adnexum does not display mass and does not display tenderness. Left adnexum does not display mass and does not display tenderness. Cervix does not exhibit motion tenderness, lesion, friability or polyp.   Uterus is mobile and midaxial. Uterus is not enlarged or exhibiting a mass.  HENT:  Head: Normocephalic and atraumatic. Head is without laceration.  Right Ear: Hearing normal.  Left Ear: Hearing normal.  Nose: No epistaxis.  No foreign bodies.  Mouth/Throat: Uvula is midline, oropharynx is clear and moist and mucous membranes are normal.  Eyes: Pupils are equal, round, and reactive to light.  Neck: Normal range of motion. Neck supple. No thyromegaly present.  Cardiovascular: Normal rate and regular rhythm. Exam reveals no gallop and no friction rub.  No murmur heard. Pulmonary/Chest: Effort normal and breath sounds normal. No respiratory distress. She has no wheezes. Right breast exhibits no mass, no skin change and no tenderness. Left breast exhibits no mass, no skin change and no tenderness.  Abdominal: Soft. Bowel sounds are normal. She exhibits no distension. There is no tenderness. There is no rebound.  Musculoskeletal: Normal range of motion.  Neurological: She is alert and oriented to person, place, and time. No cranial nerve deficit.  Skin: Skin is warm and dry.  Psychiatric: She has a normal mood and affect. Judgment normal.  Vitals reviewed.   Assessment: Annual Exam 1. Annual physical exam   2. BRCA  negative   3. Mastodynia of right breast   4. Screen for colon cancer     Plan:            1.  Cervical Screening-  Pap smear schedule reviewed with patient  2. Breast screening- Exam annually and mammogram scheduled  3. Colonoscopy every 5 or 10 years, Hemoccult testing after age 10  4. Labs managed by PCP  5. Counseling for hormonal therapy: no change in therapy today  6. Monitor breast pain and follow up on MMG Avoid caffeine and take VitE daily Discuss w PCP thyroid and role it may play in mastalgia and need for further therapy     F/U  Return in about 1 year (around 12/14/2018) for Annual.  Barnett Applebaum, MD, [facility], [facility] 12/13/2017  3:10 PM

## 2017-12-13 NOTE — Patient Instructions (Signed)
PAP every three years Mammogram every year    At [facility] 12/31/17 Colonoscopy every 5 or 10 years Labs yearly (with PCP)

## 2017-12-21 LAB — SPECIMEN STATUS REPORT

## 2017-12-21 LAB — FECAL OCCULT BLOOD, IMMUNOCHEMICAL: Fecal Occult Bld: NEGATIVE

## 2017-12-31 ENCOUNTER — Ambulatory Visit
Admission: RE | Admit: 2017-12-31 | Discharge: 2017-12-31 | Disposition: A | Discharge status: Home / Self Care | Payer: Commercial Managed Care - PPO | Source: Ambulatory Visit | Attending: Obstetrics & Gynecology | Admitting: Obstetrics & Gynecology

## 2017-12-31 DIAGNOSIS — Z1239 Encounter for other screening for malignant neoplasm of breast: Secondary | ICD-10-CM | POA: Diagnosis present

## 2018-01-14 ENCOUNTER — Ambulatory Visit: Payer: Commercial Managed Care - PPO | Admitting: Family Medicine

## 2018-01-14 ENCOUNTER — Encounter: Payer: Self-pay | Admitting: Family Medicine

## 2018-01-14 VITALS — BP 128/82 | HR 70 | Temp 98.2°F | Resp 16 | Ht 63.0 in | Wt 210.0 lb

## 2018-01-14 DIAGNOSIS — N643 Galactorrhea not associated with childbirth: Secondary | ICD-10-CM

## 2018-01-14 DIAGNOSIS — E039 Hypothyroidism, unspecified: Secondary | ICD-10-CM | POA: Diagnosis not present

## 2018-01-14 DIAGNOSIS — F419 Anxiety disorder, unspecified: Secondary | ICD-10-CM

## 2018-01-14 NOTE — Progress Notes (Signed)
Patient: Danielle Davenport Female    DOB: 1955-01-14   63 y.o.   MRN: [id number] Visit Date: 01/14/2018  Today's Provider: Wilhemena Durie, MD   Chief Complaint  Patient presents with  . Follow-up   Subjective:     HPI  Patient comes in today for a follow up. She was last seen in the office 4 months ago. No changes were made in her medications.   She wants to discuss possibly adjusting her thyroid medication. She feels that she is feeling more tired. Labs on 09/18/2017 were WNL.  She feels changing to a different medication may help her.  The main concern she has is clear discharge from her right nipple.  She has had normal mammogram ultrasound breast exam and the fluid was also evaluated and found to be benign.  She has routine follow-up with GYN in the fall.  She says her self breast exam is completely normal.  He declines a breast exam today. Lab Results  Component Value Date   TSH 3.380 09/18/2017     Allergies  Allergen Reactions  . Sertraline Hcl     uncontrollable bowels, grinding and clinching teeth.     Current Outpatient Medications:  .  acetaminophen (TYLENOL) 325 MG tablet, Take by mouth., Disp: , Rfl:  .  ALPRAZolam (XANAX) 0.25 MG tablet, Take 1 tablet (0.25 mg total) by mouth at bedtime as needed for anxiety., Disp: 30 tablet, Rfl: 5 .  aspirin (ASPIRIN 81) 81 MG chewable tablet, Chew by mouth daily., Disp: , Rfl:  .  ibuprofen (ADVIL,MOTRIN) 800 MG tablet, Take by mouth., Disp: , Rfl:  .  levothyroxine (SYNTHROID, LEVOTHROID) 50 MCG tablet, Take 0.5 tablets (25 mcg total) by mouth daily before breakfast., Disp: 30 tablet, Rfl: 11 .  Multiple Vitamins-Minerals (CENTRUM SILVER) CHEW, Chew by mouth., Disp: , Rfl:  .  Omega-3 Fatty Acids (FISH OIL) 1000 MG CAPS, Take by mouth., Disp: , Rfl:  .  omeprazole (PRILOSEC) 20 MG capsule, Take 20 mg by mouth daily., Disp: , Rfl:  .  ranitidine (ZANTAC) 150 MG tablet, Take 1 tablet (150 mg total) by mouth 2  (two) times daily., Disp: 60 tablet, Rfl: 12 .  vitamin E (VITAMIN E) 400 UNIT capsule, Take 400 Units by mouth daily., Disp: , Rfl:   Review of Systems  Constitutional: Positive for activity change and fatigue.  HENT: Negative.   Respiratory: Negative for cough and shortness of breath.   Cardiovascular: Negative for chest pain and leg swelling.  Gastrointestinal: Negative.   Endocrine: Negative.   Musculoskeletal: Negative for arthralgias, back pain and joint swelling.  Allergic/Immunologic: Negative.   Neurological: Negative for dizziness and numbness.  Psychiatric/Behavioral: Negative.     Social History   Tobacco Use  . Smoking status: Never Smoker  . Smokeless tobacco: Never Used  Substance Use Topics  . Alcohol use: No    Alcohol/week: 0.0 standard drinks      Objective:   BP 128/82 (BP Location: Left Arm, Patient Position: Sitting, Cuff Size: Large)   Pulse 70   Temp 98.2 F (36.8 C)   Resp 16   Ht 5\' 3"  (1.6 m)   Wt 210 lb (95.3 kg)   SpO2 99%   BMI 37.20 kg/m  Vitals:   01/14/18 0807  BP: 128/82  Pulse: 70  Resp: 16  Temp: 98.2 F (36.8 C)  SpO2: 99%  Weight: 210 lb (95.3 kg)  Height: 5\' 3"  (1.6 m)  Physical Exam Constitutional:      Appearance: She is well-developed.  HENT:     Head: Normocephalic and atraumatic.     Right Ear: External ear normal.     Left Ear: External ear normal.     Nose: Nose normal.  Eyes:     Conjunctiva/sclera: Conjunctivae normal.  Neck:     Musculoskeletal: Normal range of motion.  Cardiovascular:     Rate and Rhythm: Normal rate and regular rhythm.     Heart sounds: Normal heart sounds.  Chest:     Breasts: Breasts are symmetrical.        Right: Nipple discharge present. No inverted nipple, mass, skin change or tenderness.        Left: No inverted nipple, mass, nipple discharge, skin change or tenderness.  Abdominal:     General: Bowel sounds are normal.     Palpations: Abdomen is soft.  Musculoskeletal:  Normal range of motion.  Skin:    General: Skin is warm and dry.     Capillary Refill: Capillary refill takes less than 2 seconds.  Neurological:     General: No focal deficit present.     Mental Status: She is alert and oriented to person, place, and time. Mental status is at baseline.  Psychiatric:        Mood and Affect: Mood normal.        Behavior: Behavior normal.        Thought Content: Thought content normal.        Judgment: Judgment normal.         Assessment & Plan    1. Hypothyroidism, unspecified type Advised patient she may want to talk to pharmacy about changing to a different manufacturer.  We will follow-up TSH after 3 months which is now - TSH  2. Anxiety Doing pretty well.  3. Galactorrhea of right breast After discussion patient declines breast exam.  We will follow-up in 2 months.  She declines referral to a breast surgeon.  I think this is benign but she is concerned so we will follow-up.  I, Dalton Presley, CMA, am acting as a Education administrator for [employer]. Rosanna Randy, MD.   I have done the exam and reviewed the chart and it is accurate to the best of my knowledge. Development worker, community has been used and  any errors in dictation or transcription are unintentional. Miguel Aschoff M.D. [place], MD  [facility]

## 2018-01-15 ENCOUNTER — Telehealth: Payer: Self-pay

## 2018-01-15 LAB — TSH: TSH: 4.28 u[IU]/mL (ref 0.450–4.500)

## 2018-01-15 NOTE — Telephone Encounter (Signed)
-----   Message from Jerrol Banana., MD sent at 01/15/2018  8:21 AM EST ----- Increase Synthroid to 75 mcg daily from 50 mcg daily

## 2018-01-15 NOTE — Telephone Encounter (Signed)
Patient was advised to take 67mcg daily due to patient only taking 49mcg before.

## 2018-03-13 ENCOUNTER — Encounter: Payer: Self-pay | Admitting: Family Medicine

## 2018-03-13 ENCOUNTER — Ambulatory Visit: Payer: Commercial Managed Care - PPO | Admitting: Family Medicine

## 2018-03-13 VITALS — BP 145/84 | HR 71 | Temp 98.1°F | Resp 16 | Wt 212.6 lb

## 2018-03-13 DIAGNOSIS — Z6837 Body mass index (BMI) 37.0-37.9, adult: Secondary | ICD-10-CM

## 2018-03-13 DIAGNOSIS — E039 Hypothyroidism, unspecified: Secondary | ICD-10-CM

## 2018-03-13 DIAGNOSIS — E6609 Other obesity due to excess calories: Secondary | ICD-10-CM | POA: Diagnosis not present

## 2018-03-13 DIAGNOSIS — K219 Gastro-esophageal reflux disease without esophagitis: Secondary | ICD-10-CM | POA: Diagnosis not present

## 2018-03-13 NOTE — Progress Notes (Signed)
Patient: Danielle Davenport Female    DOB: Jul 11, 1955   63 y.o.   MRN: [id number] Visit Date: 03/13/2018  Today's Provider: Wilhemena Durie, MD   Chief Complaint  Patient presents with  . Hypothyroidism   Subjective:     HPI   Hypothyroid, follow-up:  TSH  Date Value Ref Range Status  01/14/2018 4.280 0.450 - 4.500 uIU/mL Final  09/18/2017 3.380 0.450 - 4.500 uIU/mL Final  08/17/2017 6.910 (H) 0.450 - 4.500 uIU/mL Final   Wt Readings from Last 3 Encounters:  03/13/18 212 lb 9.6 oz (96.4 kg)  01/14/18 210 lb (95.3 kg)  12/13/17 210 lb (95.3 kg)    She was last seen for hypothyroid 2 months ago.  Management since that visit includes Increasing Synthroid to 45mcg daily. She reports excellent compliance with treatment. She is not having side effects.  She is not exercising. She is experiencing none She denies change in energy level, diarrhea, heat / cold intolerance, nervousness, palpitations and weight changes Weight trend: fluctuating a bit  ------------------------------------------------------------------------   Allergies  Allergen Reactions  . Sertraline Hcl     uncontrollable bowels, grinding and clinching teeth.     Current Outpatient Medications:  .  acetaminophen (TYLENOL) 325 MG tablet, Take by mouth., Disp: , Rfl:  .  ALPRAZolam (XANAX) 0.25 MG tablet, Take 1 tablet (0.25 mg total) by mouth at bedtime as needed for anxiety., Disp: 30 tablet, Rfl: 5 .  aspirin (ASPIRIN 81) 81 MG chewable tablet, Chew by mouth daily., Disp: , Rfl:  .  ibuprofen (ADVIL,MOTRIN) 800 MG tablet, Take by mouth., Disp: , Rfl:  .  levothyroxine (SYNTHROID, LEVOTHROID) 50 MCG tablet, Take 0.5 tablets (25 mcg total) by mouth daily before breakfast., Disp: 30 tablet, Rfl: 11 .  Multiple Vitamins-Minerals (CENTRUM SILVER) CHEW, Chew by mouth., Disp: , Rfl:  .  Omega-3 Fatty Acids (FISH OIL) 1000 MG CAPS, Take by mouth., Disp: , Rfl:  .  omeprazole (PRILOSEC) 20 MG  capsule, Take 20 mg by mouth daily., Disp: , Rfl:  .  ranitidine (ZANTAC) 150 MG tablet, Take 1 tablet (150 mg total) by mouth 2 (two) times daily., Disp: 60 tablet, Rfl: 12 .  vitamin E (VITAMIN E) 400 UNIT capsule, Take 400 Units by mouth daily., Disp: , Rfl:   Review of Systems  HENT: Negative.   Eyes: Negative.   Respiratory: Negative for cough and shortness of breath.   Cardiovascular: Negative for chest pain and leg swelling.  Gastrointestinal: Negative.   Endocrine: Negative.   Musculoskeletal: Negative for arthralgias, back pain and joint swelling.  Allergic/Immunologic: Negative.   Neurological: Negative for dizziness and numbness.  Psychiatric/Behavioral: Negative.     Social History   Tobacco Use  . Smoking status: Never Smoker  . Smokeless tobacco: Never Used  Substance Use Topics  . Alcohol use: No    Alcohol/week: 0.0 standard drinks      Objective:   BP (!) 145/84   Pulse 71   Temp 98.1 F (36.7 C) (Oral)   Resp 16   Wt 212 lb 9.6 oz (96.4 kg)   BMI 37.66 kg/m  Vitals:   03/13/18 1003  BP: (!) 145/84  Pulse: 71  Resp: 16  Temp: 98.1 F (36.7 C)  TempSrc: Oral  Weight: 212 lb 9.6 oz (96.4 kg)     Physical Exam Vitals signs reviewed.  Constitutional:      Appearance: She is well-developed.  HENT:  Head: Normocephalic and atraumatic.     Right Ear: External ear normal.     Left Ear: External ear normal.     Nose: Nose normal.  Eyes:     Conjunctiva/sclera: Conjunctivae normal.  Neck:     Musculoskeletal: Normal range of motion.  Cardiovascular:     Rate and Rhythm: Normal rate and regular rhythm.     Heart sounds: Normal heart sounds.  Chest:     Breasts: Breasts are symmetrical.        Right: Nipple discharge present. No inverted nipple, mass, skin change or tenderness.        Left: No inverted nipple, mass, nipple discharge, skin change or tenderness.  Abdominal:     General: Bowel sounds are normal.     Palpations: Abdomen is  soft.  Musculoskeletal: Normal range of motion.  Skin:    General: Skin is warm and dry.     Capillary Refill: Capillary refill takes less than 2 seconds.  Neurological:     General: No focal deficit present.     Mental Status: She is alert and oriented to person, place, and time. Mental status is at baseline.  Psychiatric:        Mood and Affect: Mood normal.        Behavior: Behavior normal.        Thought Content: Thought content normal.        Judgment: Judgment normal.         Assessment & Plan   1. Hypothyroidism, unspecified type  - Thyroid Panel With TSH  2. Gastroesophageal reflux disease without esophagitis Stop Zantac presently as patient is asymptomatic.  Follow-up PRN.  3. Class 2 obesity due to excess calories without serious comorbidity with body mass index (BMI) of 37.0 to 37.9 in adult Clinic 6 months.     Patient seen and examined by Dr. Miguel Aschoff, note scribed by Jennings Books, NCMA I have done the exam and reviewed the above chart and it is accurate to the best of my knowledge. Development worker, community has been used in this note in any air is in the dictation or transcription are unintentional.  Wilhemena Durie, MD  [facility]

## 2018-03-14 ENCOUNTER — Ambulatory Visit: Payer: Self-pay | Admitting: Family Medicine

## 2018-03-14 LAB — THYROID PANEL WITH TSH
Free Thyroxine Index: 1.9 (ref 1.2–4.9)
T3 Uptake Ratio: 22 % — ABNORMAL LOW (ref 24–39)
T4, Total: 8.8 ug/dL (ref 4.5–12.0)
TSH: 1.9 u[IU]/mL (ref 0.450–4.500)

## 2018-03-15 ENCOUNTER — Encounter: Payer: Self-pay | Admitting: Family Medicine

## 2018-03-15 ENCOUNTER — Ambulatory Visit (INDEPENDENT_AMBULATORY_CARE_PROVIDER_SITE_OTHER): Payer: Commercial Managed Care - PPO | Admitting: Family Medicine

## 2018-03-15 VITALS — BP 146/86 | HR 80 | Temp 97.8°F | Wt 212.0 lb

## 2018-03-15 DIAGNOSIS — R1032 Left lower quadrant pain: Secondary | ICD-10-CM | POA: Diagnosis not present

## 2018-03-15 DIAGNOSIS — M545 Low back pain, unspecified: Secondary | ICD-10-CM

## 2018-03-15 DIAGNOSIS — R8281 Pyuria: Secondary | ICD-10-CM

## 2018-03-15 LAB — POCT URINALYSIS DIPSTICK
Bilirubin, UA: NEGATIVE
Glucose, UA: NEGATIVE
Ketones, UA: NEGATIVE
Nitrite, UA: NEGATIVE
Protein, UA: NEGATIVE
Spec Grav, UA: 1.015 (ref 1.010–1.025)
Urobilinogen, UA: 0.2 E.U./dL
pH, UA: 6 (ref 5.0–8.0)

## 2018-03-15 MED ORDER — CIPROFLOXACIN HCL 500 MG PO TABS: 500.0000 mg | ORAL_TABLET | Freq: Two times a day (BID) | ORAL | 0 refills | Status: DC

## 2018-03-15 NOTE — Progress Notes (Signed)
Patient: Danielle Davenport Female    DOB: 04-30-1955   63 y.o.   MRN: [id number] Visit Date: 03/15/2018  Today's Provider: Vernie Murders, PA   Chief Complaint  Patient presents with  . Back Pain   Subjective:     Back Pain  This is a new problem. The current episode started in the past 7 days. The problem has been gradually worsening since onset. The pain is present in the lumbar spine (Lower left side.). Radiates to: Left abdomen. Associated symptoms include abdominal pain. Pertinent negatives include no bladder incontinence, bowel incontinence, dysuria, fever, headaches, leg pain, numbness, paresthesias, pelvic pain, perianal numbness, tingling or weakness. She has tried NSAIDs for the symptoms. The treatment provided mild relief.    Allergies  Allergen Reactions  . Sertraline Hcl     uncontrollable bowels, grinding and clinching teeth.     Current Outpatient Medications:  .  acetaminophen (TYLENOL) 325 MG tablet, Take by mouth., Disp: , Rfl:  .  ALPRAZolam (XANAX) 0.25 MG tablet, Take 1 tablet (0.25 mg total) by mouth at bedtime as needed for anxiety., Disp: 30 tablet, Rfl: 5 .  aspirin (ASPIRIN 81) 81 MG chewable tablet, Chew by mouth daily., Disp: , Rfl:  .  ibuprofen (ADVIL,MOTRIN) 800 MG tablet, Take by mouth., Disp: , Rfl:  .  levothyroxine (SYNTHROID, LEVOTHROID) 50 MCG tablet, Take 0.5 tablets (25 mcg total) by mouth daily before breakfast., Disp: 30 tablet, Rfl: 11 .  Multiple Vitamins-Minerals (CENTRUM SILVER) CHEW, Chew by mouth., Disp: , Rfl:  .  Omega-3 Fatty Acids (FISH OIL) 1000 MG CAPS, Take by mouth., Disp: , Rfl:  .  omeprazole (PRILOSEC) 20 MG capsule, Take 20 mg by mouth daily., Disp: , Rfl:  .  ranitidine (ZANTAC) 150 MG tablet, Take 1 tablet (150 mg total) by mouth 2 (two) times daily., Disp: 60 tablet, Rfl: 12 .  vitamin E (VITAMIN E) 400 UNIT capsule, Take 400 Units by mouth daily., Disp: , Rfl:   Review of Systems  Constitutional:  Positive for chills and fatigue. Negative for appetite change, diaphoresis, fever and unexpected weight change.  Gastrointestinal: Positive for abdominal pain and nausea. Negative for abdominal distention, anal bleeding, blood in stool, bowel incontinence, constipation, diarrhea, rectal pain and vomiting.  Genitourinary: Positive for flank pain, frequency and urgency. Negative for bladder incontinence, difficulty urinating, dyspareunia, dysuria, hematuria, pelvic pain, vaginal bleeding, vaginal discharge and vaginal pain.  Musculoskeletal: Positive for back pain.  Neurological: Negative for dizziness, tingling, weakness, light-headedness, numbness, headaches and paresthesias.    Social History   Tobacco Use  . Smoking status: Never Smoker  . Smokeless tobacco: Never Used  Substance Use Topics  . Alcohol use: No    Alcohol/week: 0.0 standard drinks      Objective:   BP (!) 146/86 (BP Location: Right Arm, Patient Position: Sitting, Cuff Size: Large)   Pulse 80   Temp 97.8 F (36.6 C) (Oral)   Wt 212 lb (96.2 kg)   BMI 37.55 kg/m  Vitals:   03/15/18 1022  BP: (!) 146/86  Pulse: 80  Temp: 97.8 F (36.6 C)  TempSrc: Oral  Weight: 212 lb (96.2 kg)     Physical Exam Constitutional:      General: She is not in acute distress.    Appearance: She is well-developed.  HENT:     Head: Normocephalic and atraumatic.     Right Ear: Hearing normal.     Left Ear: Hearing normal.  Nose: Nose normal.  Eyes:     General: Lids are normal. No scleral icterus.       Right eye: No discharge.        Left eye: No discharge.     Conjunctiva/sclera: Conjunctivae normal.  Pulmonary:     Effort: Pulmonary effort is normal. No respiratory distress.  Abdominal:     General: Bowel sounds are normal.     Palpations: Abdomen is soft. There is no mass.     Tenderness: There is abdominal tenderness. There is left CVA tenderness. There is no right CVA tenderness or guarding.     Comments: LLQ  tenderness radiating toward the left flank. Left CVA tenderness to percussion and some muscle soreness in the lower lumbar region. No radiation into legs or numbness.    Musculoskeletal: Normal range of motion.  Skin:    Findings: No lesion or rash.  Neurological:     Mental Status: She is alert and oriented to person, place, and time.  Psychiatric:        Speech: Speech normal.        Behavior: Behavior normal.        Thought Content: Thought content normal.       Assessment & Plan    1. Acute left-sided low back pain, unspecified whether sciatica present Onset over the past several days without radiation to legs or increased pain with movement/twisting. Suspect UTI with pyuria on urinalysis. Ordered C&S with CBC with diff. May apply moist heat and use Tylenol or Advil prn muscle pains. Recheck pending reports. - POCT urinalysis dipstick - CULTURE, URINE COMPREHENSIVE - CBC with Differential/Platelet  2. LLQ abdominal pain Onset with back pain and some urinary frequency. States she has a history of diverticulosis on past colonoscopy exams. No diarrhea, constipation, melena, hematochezia or vomiting. Will check CBC for signs of infection and give Cipro for suspected UTI/diverticulitis. Recommend low residue bland diet with increased fluid intake. - CBC with Differential/Platelet  3. Pyuria Back discomfort and some increase in urinary frequency started the past several days. No fever but some CVA tenderness on the left. No gross hematuria. Microscopic exam of urine shows many epithelials and 10-15 WBC's per HPF. Will get C&S and start Cipro. May use AZO-Standard for bladder/ureter spasms. Increase fluid intake and recheck pending reports. - CULTURE, URINE COMPREHENSIVE - CBC with Differential/Platelet - ciprofloxacin (CIPRO) 500 MG tablet; Take 1 tablet (500 mg total) by mouth 2 (two) times daily.  Dispense: 20 tablet; Refill: [place], PA  [facility]

## 2018-03-16 LAB — CBC WITH DIFFERENTIAL/PLATELET
Basophils Absolute: 0 10*3/uL (ref 0.0–0.2)
Basos: 1 %
EOS (ABSOLUTE): 0.2 10*3/uL (ref 0.0–0.4)
Eos: 3 %
Hematocrit: 39.4 % (ref 34.0–46.6)
Hemoglobin: 13.2 g/dL (ref 11.1–15.9)
IMMATURE GRANULOCYTES: 0 %
Immature Grans (Abs): 0 10*3/uL (ref 0.0–0.1)
Lymphocytes Absolute: 3 10*3/uL (ref 0.7–3.1)
Lymphs: 38 %
MCH: 28.8 pg (ref 26.6–33.0)
MCHC: 33.5 g/dL (ref 31.5–35.7)
MCV: 86 fL (ref 79–97)
Monocytes Absolute: 0.8 10*3/uL (ref 0.1–0.9)
Monocytes: 10 %
NEUTROS PCT: 48 %
Neutrophils Absolute: 3.9 10*3/uL (ref 1.4–7.0)
Platelets: 278 10*3/uL (ref 150–450)
RBC: 4.58 x10E6/uL (ref 3.77–5.28)
RDW: 13.3 % (ref 11.7–15.4)
WBC: 8 10*3/uL (ref 3.4–10.8)

## 2018-03-19 ENCOUNTER — Telehealth: Payer: Self-pay

## 2018-03-19 LAB — CULTURE, URINE COMPREHENSIVE

## 2018-03-19 LAB — SPECIMEN STATUS REPORT

## 2018-03-19 NOTE — Telephone Encounter (Signed)
Patient advised as directed below. 

## 2018-03-19 NOTE — Telephone Encounter (Signed)
-----   Message from Margo Common, [place] sent at 03/18/2018  6:26 PM EDT ----- Blood cell counts normal. Preliminary urine culture report inconclusive. Continue Cipro and will notify when final culture report available.

## 2018-03-20 ENCOUNTER — Telehealth: Payer: Self-pay

## 2018-03-20 NOTE — Telephone Encounter (Signed)
-----   Message from Margo Common, [place] sent at 03/19/2018  5:36 PM EDT ----- No specific bacteria identified on the culture. The Cipro should clear this if drinking enough water (6 eight ounce glasses daily) to flush out urinary tract. Recheck if no better after finishing the Cipro. May need further evaluation by an urologist.

## 2018-03-20 NOTE — Telephone Encounter (Signed)
Patient has been advised she states that her pain quality is still the same from when she was in the office for evaluation. I encouraged patient to increase fluids and to complete antibiotic. If no improvement patient instructed to RTC. KW

## 2018-03-21 NOTE — Telephone Encounter (Signed)
Agree with plan 

## 2018-03-22 MED ORDER — LEVOTHYROXINE SODIUM 50 MCG PO TABS: 25.0000 ug | ORAL_TABLET | Freq: Every day | ORAL | 11 refills | Status: DC

## 2018-09-30 ENCOUNTER — Ambulatory Visit (INDEPENDENT_AMBULATORY_CARE_PROVIDER_SITE_OTHER): Payer: Commercial Managed Care - PPO | Admitting: Family Medicine

## 2018-09-30 ENCOUNTER — Encounter: Payer: Self-pay | Admitting: Family Medicine

## 2018-09-30 ENCOUNTER — Other Ambulatory Visit: Payer: Self-pay

## 2018-09-30 VITALS — BP 128/82 | HR 68 | Temp 98.4°F | Resp 16 | Ht 63.5 in | Wt 207.0 lb

## 2018-09-30 DIAGNOSIS — Z0000 Encounter for general adult medical examination without abnormal findings: Secondary | ICD-10-CM | POA: Diagnosis not present

## 2018-09-30 DIAGNOSIS — L570 Actinic keratosis: Secondary | ICD-10-CM

## 2018-09-30 DIAGNOSIS — I872 Venous insufficiency (chronic) (peripheral): Secondary | ICD-10-CM | POA: Diagnosis not present

## 2018-09-30 NOTE — Progress Notes (Signed)
Patient: Danielle Davenport, Female    DOB: [birth year], 63 y.o.   MRN: [id number] Visit Date: 09/30/2018  Today's Provider: Wilhemena Durie, MD   Chief Complaint  Patient presents with  . Annual Exam   Subjective:     Annual physical exam Danielle Davenport is a 64 y.o. female who presents today for health maintenance and complete physical. She feels well. She reports exercising not regularly, but she does stay active. She reports she is sleeping well.  Colonoscopy- 07/28/2009. Internal hemorrhoids, otherwise normal. Repeat 81yr. Mammogram- 12/31/2017. Normal.  Pap- 09/01/2015. Normal.  Immunization History  Administered Date(s) Administered  . Tdap 12/09/2013     Review of Systems  Constitutional: Negative.   HENT: Negative.   Eyes: Negative.   Respiratory: Negative.   Cardiovascular: Negative.   Gastrointestinal: Negative.   Endocrine: Negative.   Genitourinary: Negative.   Musculoskeletal: Negative.   Skin: Negative.   Allergic/Immunologic: Negative.   Neurological: Negative.   Hematological: Negative.   Psychiatric/Behavioral: Negative.     Social History      She  reports that she has never smoked. She has never used smokeless tobacco. She reports that she does not drink alcohol or use drugs.       Social History   Socioeconomic History  . Marital status: Married    Spouse name: Danielle Davenport . Number of children: 2  . Years of education: HS  . Highest education level: Not on file  Occupational History  . Occupation:       WORK    Employer: [employer] . Financial resource strain: Not on file  . Food insecurity    Worry: Not on file    Inability: Not on file  . Transportation needs    Medical: Not on file    Non-medical: Not on file  Tobacco Use  . Smoking status: Never Smoker  . Smokeless tobacco: Never Used  Substance and Sexual Activity  . Alcohol use: No    Alcohol/week: 0.0 standard drinks  . Drug  use: No  . Sexual activity: Yes    Birth control/protection: Post-menopausal  Lifestyle  . Physical activity    Days per week: Not on file    Minutes per session: Not on file  . Stress: Not on file  Relationships  . Social [place] phone: Not on file    Gets together: Not on file    Attends religious service: Not on file    Active member of club or organization: Not on file    Attends meetings of clubs or organizations: Not on file    Relationship status: Not on file  Other Topics Concern  . Not on file  Social History Narrative  . Not on file    Past Medical History:  Diagnosis Date  . Arthritis   . BRCA negative   . Colon polyp   . Depression   . Diverticulosis   . Functional disorder of stomach   . Gastroduodenal ulcer   . GERD (gastroesophageal reflux disease)   . Liver disorder   . Thyroid disease    Hypothyroid     Patient Active Problem List   Diagnosis Date Noted  . Mastodynia of right breast 12/13/2017  . BRCA negative 08/17/2017  . Leg pain 04/03/2016  . Chronic venous insufficiency 04/03/2016  . Lymphedema 04/03/2016  . [place] 04/03[place]  . Colon polyp  05/13/2014  . Adiposity 05/13/2014  . Gastroduodenal ulcer 05/13/2014  . DD (diverticular disease) 05/13/2014    Past Surgical History:  Procedure Laterality Date  . BREAST EXCISIONAL BIOPSY Left 2000   benign  . HYSTEROSCOPY    . INCONTINENCE SURGERY    . TUBAL LIGATION    . UTERINE FIBROID SURGERY      Family History        Family Status  Relation Name Status  . Mother  Deceased  . Father  Deceased  . Brother  Alive  . Daughter  Alive  . Son  Alive  . MGF  (Not Specified)  . PGF  (Not Specified)  . Sister  (Not Specified)  . Mat Aunt  (Not Specified)  . Mat Uncle  (Not Specified)        Her family history includes Brain cancer in her mother and sister; Breast cancer in her maternal aunt; Breast cancer (age of onset: 60) in her mother;  COPD in her brother and daughter; Cirrhosis in her father; Emphysema in her maternal grandfather; Stroke in her maternal uncle and paternal grandfather.      Allergies  Allergen Reactions  . Sertraline Hcl     uncontrollable bowels, grinding and clinching teeth.     Current Outpatient Medications:  .  acetaminophen (TYLENOL) 325 MG tablet, Take by mouth., Disp: , Rfl:  .  ALPRAZolam (XANAX) 0.25 MG tablet, Take 1 tablet (0.25 mg total) by mouth at bedtime as needed for anxiety., Disp: 30 tablet, Rfl: 5 .  aspirin (ASPIRIN 81) 81 MG chewable tablet, Chew by mouth daily., Disp: , Rfl:  .  ibuprofen (ADVIL,MOTRIN) 800 MG tablet, Take by mouth., Disp: , Rfl:  .  levothyroxine (SYNTHROID, LEVOTHROID) 50 MCG tablet, Take 0.5 tablets (25 mcg total) by mouth daily before breakfast. (Patient taking differently: Take 50 mcg by mouth daily before breakfast. ), Disp: 30 tablet, Rfl: 11 .  Multiple Vitamins-Minerals (CENTRUM SILVER) CHEW, Chew by mouth., Disp: , Rfl:  .  Omega-3 Fatty Acids (FISH OIL) 1000 MG CAPS, Take by mouth., Disp: , Rfl:  .  omeprazole (PRILOSEC) 20 MG capsule, Take 20 mg by mouth daily., Disp: , Rfl:  .  vitamin E (VITAMIN E) 400 UNIT capsule, Take 400 Units by mouth daily., Disp: , Rfl:  .  ciprofloxacin (CIPRO) 500 MG tablet, Take 1 tablet (500 mg total) by mouth 2 (two) times daily. (Patient not taking: Reported on 09/30/2018), Disp: 20 tablet, Rfl: 0 .  ranitidine (ZANTAC) 150 MG tablet, Take 1 tablet (150 mg total) by mouth 2 (two) times daily. (Patient not taking: Reported on 09/30/2018), Disp: 60 tablet, Rfl: 12   Patient Care Team: Jerrol Banana., MD as PCP - General (Family Medicine)    Objective:    Vitals: BP 128/82   Pulse 68   Temp 98.4 F (36.9 C)   Resp 16   Ht 5' 3.5" (1.613 m)   Wt 207 lb (93.9 kg)   BMI 36.09 kg/m    Vitals:   09/30/18 1027  BP: 128/82  Pulse: 68  Resp: 16  Temp: 98.4 F (36.9 C)  Weight: 207 lb (93.9 kg)  Height: 5'  3.5" (1.613 m)     Physical Exam Vitals signs reviewed.  Constitutional:      Appearance: She is well-developed.  HENT:     Head: Normocephalic and atraumatic.     Right Ear: External ear normal.     Left Ear: External ear  normal.     Nose: Nose normal.  Eyes:     Conjunctiva/sclera: Conjunctivae normal.  Neck:     Musculoskeletal: Normal range of motion.  Cardiovascular:     Rate and Rhythm: Normal rate and regular rhythm.     Heart sounds: Normal heart sounds.  Chest:     Breasts: Breasts are symmetrical.        Right: No inverted nipple, mass, skin change or tenderness.        Left: No inverted nipple, mass, nipple discharge, skin change or tenderness.  Abdominal:     General: Bowel sounds are normal.     Palpations: Abdomen is soft.  Musculoskeletal: Normal range of motion.     Comments: Mild right pedal edema.  Skin:    General: Skin is warm and dry.     Capillary Refill: Capillary refill takes less than 2 seconds.  Neurological:     Mental Status: She is alert and oriented to person, place, and time.  Psychiatric:        Behavior: Behavior normal.        Thought Content: Thought content normal.        Judgment: Judgment normal.      Depression Screen PHQ 2/9 Scores 09/18/2017 03/26/2017 06/28/2016 06/06/2016  PHQ - 2 Score _0 PHQ- 9 Score _1 Assessment & Plan:     Routine Health Maintenance and Physical Exam  Exercise Activities and Dietary recommendations Goals   None     Immunization History  Administered Date(s) Administered  . Tdap 12/09/2013    Health Maintenance  Topic Date Due  . HIV Screening  05/20/1970  . INFLUENZA VACCINE  08/10/2018  . PAP SMEAR-Modifier  09/01/2018  . COLONOSCOPY  07/29/2019  . MAMMOGRAM  01/01/2020  . TETANUS/TDAP  12/10/2023  . Hepatitis C Screening  Completed     Discussed health benefits of physical activity, and encouraged her to engage in regular exercise appropriate for her age and  condition.  1. Annual physical exam  - CBC with Differential/Platelet - Comprehensive metabolic panel - TSH - Lipid panel  2. AK (actinic keratosis) AKs of face. - Ambulatory referral to Dermatology  3. Venous insufficiency (chronic) (peripheral)   Wilhemena Durie, MD  [facility]

## 2018-10-02 ENCOUNTER — Telehealth: Payer: Self-pay

## 2018-10-02 LAB — CBC WITH DIFFERENTIAL/PLATELET
Basophils Absolute: 0.1 10*3/uL (ref 0.0–0.2)
Basos: 1 %
EOS (ABSOLUTE): 0.3 10*3/uL (ref 0.0–0.4)
Eos: 3 %
Hematocrit: 38.6 % (ref 34.0–46.6)
Hemoglobin: 12.9 g/dL (ref 11.1–15.9)
Immature Grans (Abs): 0.1 10*3/uL (ref 0.0–0.1)
Immature Granulocytes: 1 %
Lymphocytes Absolute: 3.1 10*3/uL (ref 0.7–3.1)
Lymphs: 38 %
MCH: 28.5 pg (ref 26.6–33.0)
MCHC: 33.4 g/dL (ref 31.5–35.7)
MCV: 85 fL (ref 79–97)
Monocytes Absolute: 0.7 10*3/uL (ref 0.1–0.9)
Monocytes: 8 %
Neutrophils Absolute: 4 10*3/uL (ref 1.4–7.0)
Neutrophils: 49 %
Platelets: 281 10*3/uL (ref 150–450)
RBC: 4.52 x10E6/uL (ref 3.77–5.28)
RDW: 13.4 % (ref 11.7–15.4)
WBC: 8.1 10*3/uL (ref 3.4–10.8)

## 2018-10-02 LAB — COMPREHENSIVE METABOLIC PANEL
ALT: 20 IU/L (ref 0–32)
AST: 18 IU/L (ref 0–40)
Albumin/Globulin Ratio: 2.1 (ref 1.2–2.2)
Albumin: 5 g/dL — ABNORMAL HIGH (ref 3.8–4.8)
Alkaline Phosphatase: 127 IU/L — ABNORMAL HIGH (ref 39–117)
BUN/Creatinine Ratio: 19 (ref 12–28)
BUN: 15 mg/dL (ref 8–27)
Bilirubin Total: 0.4 mg/dL (ref 0.0–1.2)
CO2: 24 mmol/L (ref 20–29)
Calcium: 9.3 mg/dL (ref 8.7–10.3)
Chloride: 101 mmol/L (ref 96–106)
Creatinine, Ser: 0.81 mg/dL (ref 0.57–1.00)
GFR calc Af Amer: 89 mL/min/{1.73_m2} (ref >59)
GFR calc non Af Amer: 78 mL/min/{1.73_m2} (ref >59)
Globulin, Total: 2.4 g/dL (ref 1.5–4.5)
Glucose: 91 mg/dL (ref 65–99)
Potassium: 4.3 mmol/L (ref 3.5–5.2)
Sodium: 140 mmol/L (ref 134–144)
Total Protein: 7.4 g/dL (ref 6.0–8.5)

## 2018-10-02 LAB — LIPID PANEL
Chol/HDL Ratio: 4 ratio (ref 0.0–4.4)
Cholesterol, Total: 230 mg/dL — ABNORMAL HIGH (ref 100–199)
HDL: 58 mg/dL
LDL Chol Calc (NIH): 142 mg/dL — ABNORMAL HIGH (ref 0–99)
Triglycerides: 168 mg/dL — ABNORMAL HIGH (ref 0–149)
VLDL Cholesterol Cal: 30 mg/dL (ref 5–40)

## 2018-10-02 LAB — TSH: TSH: 5.14 u[IU]/mL — ABNORMAL HIGH (ref 0.450–4.500)

## 2018-10-02 MED ORDER — LEVOTHYROXINE SODIUM 75 MCG PO TABS: 75.0000 ug | ORAL_TABLET | Freq: Every day | ORAL | 3 refills | Status: DC

## 2018-10-02 NOTE — Telephone Encounter (Signed)
Sorry I missed that, would increase Synthroid from 25 to 50 mcg daily.  I think she is taking half of 50, just take a full 50

## 2018-10-02 NOTE — Telephone Encounter (Signed)
Advised and new Rx sent to pharmacy

## 2018-10-02 NOTE — Telephone Encounter (Signed)
75--thanks

## 2018-10-02 NOTE — Telephone Encounter (Signed)
Patient is already taking 15mcg. I forgot to update this in her chart. Should she go to 75 or 11mcg?

## 2018-10-02 NOTE — Telephone Encounter (Signed)
Advised patient of results. Patient's TSH level was elevated, should patient adjust levothyroxine? Please advise. Thanks!

## 2018-10-02 NOTE — Telephone Encounter (Signed)
Left message to call back  

## 2018-10-02 NOTE — Telephone Encounter (Signed)
-----   Message from Jerrol Banana., MD sent at 10/02/2018  8:45 AM EDT ----- Labs stable, lipids higher.  Work on diet and exercise.

## 2018-10-09 ENCOUNTER — Encounter: Payer: Commercial Managed Care - PPO | Admitting: Family Medicine

## 2018-10-24 ENCOUNTER — Other Ambulatory Visit: Payer: Self-pay

## 2018-10-24 ENCOUNTER — Other Ambulatory Visit: Payer: Self-pay | Admitting: Family Medicine

## 2018-10-24 ENCOUNTER — Ambulatory Visit: Payer: Commercial Managed Care - PPO | Admitting: Family Medicine

## 2018-10-24 ENCOUNTER — Encounter: Payer: Self-pay | Admitting: *Deleted

## 2018-10-24 ENCOUNTER — Encounter: Payer: Self-pay | Admitting: Family Medicine

## 2018-10-24 VITALS — BP 120/68 | HR 94 | Temp 96.9°F | Resp 16 | Ht 64.0 in | Wt 205.0 lb

## 2018-10-24 DIAGNOSIS — R300 Dysuria: Secondary | ICD-10-CM

## 2018-10-24 DIAGNOSIS — Z1231 Encounter for screening mammogram for malignant neoplasm of breast: Secondary | ICD-10-CM

## 2018-10-24 DIAGNOSIS — N3000 Acute cystitis without hematuria: Secondary | ICD-10-CM

## 2018-10-24 LAB — POCT URINALYSIS DIPSTICK
Appearance: NORMAL
Bilirubin, UA: NEGATIVE
Glucose, UA: NEGATIVE
Nitrite, UA: NEGATIVE
Odor: NORMAL
Protein, UA: NEGATIVE
Spec Grav, UA: >=1.03 — AB (ref 1.010–1.025)
Urobilinogen, UA: 0.2 E.U./dL
pH, UA: 6 (ref 5.0–8.0)

## 2018-10-24 MED ORDER — NITROFURANTOIN MONOHYD MACRO 100 MG PO CAPS: 100.0000 mg | ORAL_CAPSULE | Freq: Two times a day (BID) | ORAL | 0 refills | Status: DC

## 2018-10-24 NOTE — Progress Notes (Deleted)
P 

## 2018-10-24 NOTE — Progress Notes (Signed)
Patient: Danielle Davenport Female    DOB: 1955-08-15   63 y.o.   MRN: [id number] Visit Date: 10/24/2018  Today's Provider: Wilhemena Durie, MD   Chief Complaint  Patient presents with  . Back Pain  . Hypertension   Subjective:     Dysuria  This is a new problem. The current episode started yesterday. The problem has been rapidly worsening. The quality of the pain is described as aching. The pain is mild. There has been no fever. Associated symptoms include flank pain, frequency, nausea and urgency. Pertinent negatives include no chills, discharge, hematuria, hesitancy, possible pregnancy, sweats or vomiting. She has tried increased fluids for the symptoms. The treatment provided no relief.    Patient states she began having low back pain and lower abdominal pain yesterday. Patient states that she also had elevated bp, dizziness, frequency, and discomfort when urinating. Patient states she has had slight nausea today. Patient states she has increased fluids with no relief.   Allergies  Allergen Reactions  . Sertraline Hcl     uncontrollable bowels, grinding and clinching teeth.     Current Outpatient Medications:  .  acetaminophen (TYLENOL) 325 MG tablet, Take by mouth., Disp: , Rfl:  .  ALPRAZolam (XANAX) 0.25 MG tablet, Take 1 tablet (0.25 mg total) by mouth at bedtime as needed for anxiety., Disp: 30 tablet, Rfl: 5 .  aspirin (ASPIRIN 81) 81 MG chewable tablet, Chew by mouth daily., Disp: , Rfl:  .  ibuprofen (ADVIL,MOTRIN) 800 MG tablet, Take by mouth., Disp: , Rfl:  .  levothyroxine (SYNTHROID) 75 MCG tablet, Take 1 tablet (75 mcg total) by mouth daily., Disp: 90 tablet, Rfl: 3 .  Multiple Vitamins-Minerals (CENTRUM SILVER) CHEW, Chew by mouth., Disp: , Rfl:  .  Omega-3 Fatty Acids (FISH OIL) 1000 MG CAPS, Take by mouth., Disp: , Rfl:  .  omeprazole (PRILOSEC) 20 MG capsule, Take 20 mg by mouth daily., Disp: , Rfl:  .  vitamin E (VITAMIN E) 400 UNIT capsule,  Take 400 Units by mouth daily., Disp: , Rfl:  .  ciprofloxacin (CIPRO) 500 MG tablet, Take 1 tablet (500 mg total) by mouth 2 (two) times daily. (Patient not taking: Reported on 09/30/2018), Disp: 20 tablet, Rfl: 0 .  ranitidine (ZANTAC) 150 MG tablet, Take 1 tablet (150 mg total) by mouth 2 (two) times daily. (Patient not taking: Reported on 09/30/2018), Disp: 60 tablet, Rfl: 12  Review of Systems  Constitutional: Negative for appetite change, chills, fatigue and fever.  Respiratory: Negative for chest tightness and shortness of breath.   Cardiovascular: Negative for chest pain and palpitations.  Gastrointestinal: Positive for nausea. Negative for abdominal pain and vomiting.  Genitourinary: Positive for dysuria, flank pain, frequency and urgency. Negative for hematuria and hesitancy.  Neurological: Negative for dizziness and weakness.    Social History   Tobacco Use  . Smoking status: Never Smoker  . Smokeless tobacco: Never Used  Substance Use Topics  . Alcohol use: No    Alcohol/week: 0.0 standard drinks      Objective:   BP 120/68 (BP Location: Right Arm, Patient Position: Sitting, Cuff Size: Large)   Pulse 94   Temp (!) 96.9 F (36.1 C) (Other (Comment))   Resp 16   Ht 5\' 4"  (1.626 m)   Wt 205 lb (93 kg)   SpO2 94%   BMI 35.19 kg/m  Vitals:   10/24/18 1441  BP: 120/68  Pulse: 94  Resp: 16  Temp: (!) 96.9 F (36.1 C)  TempSrc: Other (Comment)  SpO2: 94%  Weight: 205 lb (93 kg)  Height: 5\' 4"  (1.626 m)  Body mass index is 35.19 kg/m.   Physical Exam Vitals signs reviewed.  Constitutional:      Appearance: She is well-developed.  HENT:     Head: Normocephalic and atraumatic.     Right Ear: External ear normal.     Left Ear: External ear normal.     Nose: Nose normal.  Eyes:     Conjunctiva/sclera: Conjunctivae normal.  Neck:     Musculoskeletal: Normal range of motion.  Cardiovascular:     Rate and Rhythm: Normal rate and regular rhythm.     Heart  sounds: Normal heart sounds.  Chest:     Breasts: Breasts are symmetrical.        Right: Nipple discharge present. No inverted nipple, mass, skin change or tenderness.        Left: No inverted nipple, mass, nipple discharge, skin change or tenderness.  Abdominal:     General: Bowel sounds are normal.     Palpations: Abdomen is soft.  Musculoskeletal: Normal range of motion.  Skin:    General: Skin is warm and dry.     Capillary Refill: Capillary refill takes less than 2 seconds.  Neurological:     General: No focal deficit present.     Mental Status: She is alert and oriented to person, place, and time. Mental status is at baseline.  Psychiatric:        Mood and Affect: Mood normal.        Behavior: Behavior normal.        Thought Content: Thought content normal.        Judgment: Judgment normal.      No results found for any visits on 10/24/18.     Assessment & Plan    1. Dysuria  - POCT urinalysis dipstick - CULTURE, URINE COMPREHENSIVE  2. Acute cystitis without hematuria Nitrofurantoin for 5 days.      Cranford Mon, MD  [facility]

## 2018-10-24 NOTE — Patient Instructions (Addendum)
1. Urinary Tract Infection Increase fluids, also can drink cranberry juice. Can take OTC AZO to help with urinary symptoms.   - nitrofurantoin, macrocrystal-monohydrate, (MACROBID) 100 MG capsule, 1     capsule 2 times daily.  - poct urinalysis - Comprehensive Urine Culture

## 2018-10-27 LAB — CULTURE, URINE COMPREHENSIVE

## 2019-01-06 ENCOUNTER — Ambulatory Visit
Admission: RE | Admit: 2019-01-06 | Discharge: 2019-01-06 | Disposition: A | Discharge status: Home / Self Care | Payer: Commercial Managed Care - PPO | Source: Ambulatory Visit | Attending: Family Medicine | Admitting: Family Medicine

## 2019-01-06 DIAGNOSIS — Z1231 Encounter for screening mammogram for malignant neoplasm of breast: Secondary | ICD-10-CM | POA: Diagnosis present

## 2019-01-20 ENCOUNTER — Telehealth: Payer: Self-pay

## 2019-01-20 NOTE — Telephone Encounter (Signed)
LVM with pharmacy to switch brands for the levothyroxine 75 mcg tablet.

## 2019-09-30 NOTE — Progress Notes (Signed)
I,Ervan Heber,acting as a scribe for Wilhemena Durie, MD.,have documented all relevant documentation on the behalf of Wilhemena Durie, MD,as directed by  Wilhemena Durie, MD while in the presence of Wilhemena Durie, MD.   Complete physical exam   Patient: Danielle Davenport   DOB: 1955-03-01   64 y.o. Female  MRN: [id number] Visit Date: 10/01/2019  Today's healthcare provider: Wilhemena Durie, MD   Chief Complaint  Patient presents with   Annual Exam   Subjective    Danielle Davenport is a 64 y.o. female who presents today for a complete physical exam.  She reports consuming a general diet. The patient does not participate in regular exercise at present. She generally feels well. She reports sleeping well. She does not have additional problems to discuss today.  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    Colon polyp    Depression    Diverticulosis    Functional disorder of stomach    Gastroduodenal ulcer    GERD (gastroesophageal reflux disease)    Liver disorder    Thyroid disease    Hypothyroid   Past Surgical History:  Procedure Laterality Date   BREAST EXCISIONAL BIOPSY Left 2000   benign   HYSTEROSCOPY     INCONTINENCE SURGERY     TUBAL LIGATION     UTERINE FIBROID SURGERY     Social History   Socioeconomic History   Marital status: Married    Spouse name: Jori Moll   Number of children: 2   Years of education: HS   Highest education level: Not on file  Occupational History   Occupation:       WORK    Employer: [employer]  Tobacco Use   Smoking status: Never Smoker   Smokeless tobacco: Never Used  Scientific laboratory technician Use: Never used  Substance and Sexual Activity   Alcohol use: No    Alcohol/week: 0.0 standard drinks   Drug use: No   Sexual activity: Yes    Birth control/protection: Post-menopausal  Other Topics Concern   Not on file  Social  History Narrative   Not on file   Social Determinants of Health   Financial Resource Strain:    Difficulty of Paying Living Expenses: Not on file  Food Insecurity:    Worried About Charity fundraiser in the Last Year: Not on file   [employer] of Food in the Last Year: Not on file  Transportation Needs:    Lack of Transportation (Medical): Not on file   Lack of Transportation (Non-Medical): Not on file  Physical Activity:    Days of Exercise per Week: Not on file   Minutes of Exercise per Session: Not on file  Stress:    Feeling of Stress : Not on file  Social Connections:    Frequency of Communication with Friends and Family: Not on file   Frequency of Social Gatherings with Friends and Family: Not on file   Attends Religious Services: Not on file   Active Member of Clubs or Organizations: Not on file   Attends Archivist Meetings: Not on file   Marital Status: Not on file  Intimate Partner Violence:    Fear of Current or Ex-Partner: Not on file   Emotionally Abused: Not on file   Physically Abused: Not on file   Sexually Abused: Not on file  Family Status  Relation Name Status   Mother  Deceased   Father  Deceased   Brother  Alive   Daughter  Alive   Son  Alive   MGF  (Not Specified)   PGF  (Not Specified)   Sister  (Not Specified)   Mat Aunt  (Not Specified)   Mat Uncle  (Not Specified)   Family History  Problem Relation Age of Onset   Breast cancer Mother 40   Brain cancer Mother    Cirrhosis Father    COPD Brother    COPD Daughter    Emphysema Maternal Grandfather    Stroke Paternal Grandfather    Brain cancer Sister    Breast cancer Maternal Aunt    Stroke Maternal Uncle    Allergies  Allergen Reactions   Sertraline Hcl     uncontrollable bowels, grinding and clinching teeth.    Patient Care Team: Jerrol Banana., MD as PCP - General (Family Medicine)   Medications: Outpatient Medications  Prior to Visit  Medication Sig   acetaminophen (TYLENOL) 325 MG tablet Take by mouth.   ALPRAZolam (XANAX) 0.25 MG tablet Take 1 tablet (0.25 mg total) by mouth at bedtime as needed for anxiety.   aspirin (ASPIRIN 81) 81 MG chewable tablet Chew by mouth daily.   ibuprofen (ADVIL,MOTRIN) 800 MG tablet Take by mouth.   levothyroxine (SYNTHROID) 75 MCG tablet Take 1 tablet (75 mcg total) by mouth daily.   Multiple Vitamins-Minerals (CENTRUM SILVER) CHEW Chew by mouth.   Omega-3 Fatty Acids (FISH OIL) 1000 MG CAPS Take by mouth.   omeprazole (PRILOSEC) 20 MG capsule Take 20 mg by mouth daily.   vitamin E (VITAMIN E) 400 UNIT capsule Take 400 Units by mouth daily.   ciprofloxacin (CIPRO) 500 MG tablet Take 1 tablet (500 mg total) by mouth 2 (two) times daily. (Patient not taking: Reported on 09/30/2018)   nitrofurantoin, macrocrystal-monohydrate, (MACROBID) 100 MG capsule Take 1 capsule (100 mg total) by mouth 2 (two) times daily. (Patient not taking: Reported on 10/01/2019)   ranitidine (ZANTAC) 150 MG tablet Take 1 tablet (150 mg total) by mouth 2 (two) times daily. (Patient not taking: Reported on 09/30/2018)   No facility-administered medications prior to visit.    Review of Systems  All other systems reviewed and are negative.     Objective    BP 129/83 (BP Location: Left Arm, Patient Position: Sitting, Cuff Size: Large)    Pulse 78    Temp 98.1 F (36.7 C) (Oral)    Resp 16    Ht 5' 4"  (1.626 m)    Wt 213 lb (96.6 kg)    SpO2 98%    BMI 36.56 kg/m    Physical Exam Vitals reviewed.  Constitutional:      Appearance: She is well-developed.  HENT:     Head: Normocephalic and atraumatic.     Right Ear: External ear normal.     Left Ear: External ear normal.     Nose: Nose normal.  Eyes:     Conjunctiva/sclera: Conjunctivae normal.  Cardiovascular:     Rate and Rhythm: Normal rate and regular rhythm.     Heart sounds: Normal heart sounds.  Chest:     Breasts: Breasts  are symmetrical.        Right: No inverted nipple, mass, skin change or tenderness.        Left: No inverted nipple, mass, nipple discharge, skin change or tenderness.  Abdominal:  General: Bowel sounds are normal.     Palpations: Abdomen is soft.  Musculoskeletal:        General: Normal range of motion.     Cervical back: Normal range of motion.  Skin:    General: Skin is warm and dry.     Capillary Refill: Capillary refill takes less than 2 seconds.  Neurological:     General: No focal deficit present.     Mental Status: She is alert and oriented to person, place, and time.  Psychiatric:        Mood and Affect: Mood normal.        Behavior: Behavior normal.        Thought Content: Thought content normal.        Judgment: Judgment normal.        Last depression screening scores PHQ 2/9 Scores 10/01/2019 09/18/2017 03/26/2017  PHQ - 2 Score 0 2 1  PHQ- 9 Score 2 4 2    Last fall risk screening Fall Risk  06/28/2016  Falls in the past year? No   Last Audit-C alcohol use screening Alcohol Use Disorder Test (AUDIT) 10/01/2019  1. How often do you have a drink containing alcohol? 1  2. How many drinks containing alcohol do you have on a typical day when you are drinking? 0  3. How often do you have six or more drinks on one occasion? 0  AUDIT-C Score 1  Alcohol Brief Interventions/Follow-up AUDIT Score <7 follow-up not indicated   A score of 3 or more in women, and 4 or more in men indicates increased risk for alcohol abuse, EXCEPT if all of the points are from question 1   No results found for any visits on 10/01/19.  Assessment & Plan    Routine Health Maintenance and Physical Exam  Exercise Activities and Dietary recommendations Goals   None     Immunization History  Administered Date(s) Administered   Tdap 12/09/2013    Health Maintenance  Topic Date Due   COVID-19 Vaccine (1) Never done   HIV Screening  Never done   PAP SMEAR-Modifier  09/01/2018    COLONOSCOPY  07/29/2019   INFLUENZA VACCINE  08/10/2019   MAMMOGRAM  01/05/2021   TETANUS/TDAP  12/10/2023   Hepatitis C Screening  Completed    Discussed health benefits of physical activity, and encouraged her to engage in regular exercise appropriate for her age and condition.  1. Annual physical exam Well woman exam per GYN, Dr.Harris - CBC w/Diff/Platelet - TSH - Lipid panel - Comprehensive Metabolic Panel (CMET)  2. Hypothyroidism, unspecified type  - CBC w/Diff/Platelet - TSH - Lipid panel - Comprehensive Metabolic Panel (CMET)  3. Colon cancer screening  - Ambulatory referral to Gastroenterology 4.Arthralgia Try turmeric daily  Return in about 1 year (around 09/30/2020).        Richard Cranford Mon, MD  [facility] [phone] (phone) [phone] (fax)  [place]

## 2019-10-01 ENCOUNTER — Other Ambulatory Visit: Payer: Self-pay | Admitting: *Deleted

## 2019-10-01 ENCOUNTER — Encounter: Payer: Self-pay | Admitting: Family Medicine

## 2019-10-01 ENCOUNTER — Other Ambulatory Visit: Payer: Self-pay

## 2019-10-01 ENCOUNTER — Ambulatory Visit (INDEPENDENT_AMBULATORY_CARE_PROVIDER_SITE_OTHER): Payer: Commercial Managed Care - PPO | Admitting: Family Medicine

## 2019-10-01 VITALS — BP 129/83 | HR 78 | Temp 98.1°F | Resp 16 | Ht 64.0 in | Wt 213.0 lb

## 2019-10-01 DIAGNOSIS — I890 Lymphedema, not elsewhere classified: Secondary | ICD-10-CM

## 2019-10-01 DIAGNOSIS — Z0000 Encounter for general adult medical examination without abnormal findings: Secondary | ICD-10-CM | POA: Diagnosis not present

## 2019-10-01 DIAGNOSIS — E039 Hypothyroidism, unspecified: Secondary | ICD-10-CM | POA: Diagnosis not present

## 2019-10-01 DIAGNOSIS — Z1211 Encounter for screening for malignant neoplasm of colon: Secondary | ICD-10-CM

## 2019-10-01 DIAGNOSIS — M2550 Pain in unspecified joint: Secondary | ICD-10-CM

## 2019-10-01 DIAGNOSIS — E6609 Other obesity due to excess calories: Secondary | ICD-10-CM

## 2019-10-01 DIAGNOSIS — F419 Anxiety disorder, unspecified: Secondary | ICD-10-CM

## 2019-10-01 DIAGNOSIS — Z6837 Body mass index (BMI) 37.0-37.9, adult: Secondary | ICD-10-CM

## 2019-10-01 MED ORDER — ALPRAZOLAM 0.25 MG PO TABS: 0.2500 mg | ORAL_TABLET | Freq: Every evening | ORAL | 5 refills | Status: DC | PRN

## 2019-10-01 NOTE — Patient Instructions (Signed)
Try Turmeric Daily.

## 2019-10-02 LAB — COMPREHENSIVE METABOLIC PANEL
ALT: 24 IU/L (ref 0–32)
AST: 16 IU/L (ref 0–40)
Albumin/Globulin Ratio: 1.9 (ref 1.2–2.2)
Albumin: 4.6 g/dL (ref 3.8–4.8)
Alkaline Phosphatase: 120 IU/L (ref 44–121)
BUN/Creatinine Ratio: 20 (ref 12–28)
BUN: 14 mg/dL (ref 8–27)
Bilirubin Total: 0.3 mg/dL (ref 0.0–1.2)
CO2: 23 mmol/L (ref 20–29)
Calcium: 9.1 mg/dL (ref 8.7–10.3)
Chloride: 105 mmol/L (ref 96–106)
Creatinine, Ser: 0.71 mg/dL (ref 0.57–1.00)
GFR calc Af Amer: 104 mL/min/{1.73_m2} (ref >59)
GFR calc non Af Amer: 90 mL/min/{1.73_m2} (ref >59)
Globulin, Total: 2.4 g/dL (ref 1.5–4.5)
Glucose: 91 mg/dL (ref 65–99)
Potassium: 4.2 mmol/L (ref 3.5–5.2)
Sodium: 142 mmol/L (ref 134–144)
Total Protein: 7 g/dL (ref 6.0–8.5)

## 2019-10-02 LAB — CBC WITH DIFFERENTIAL/PLATELET
Basophils Absolute: 0.1 10*3/uL (ref 0.0–0.2)
Basos: 1 %
EOS (ABSOLUTE): 0.2 10*3/uL (ref 0.0–0.4)
Eos: 3 %
Hematocrit: 38.2 % (ref 34.0–46.6)
Hemoglobin: 12.8 g/dL (ref 11.1–15.9)
Immature Grans (Abs): 0 10*3/uL (ref 0.0–0.1)
Immature Granulocytes: 0 %
Lymphocytes Absolute: 3 10*3/uL (ref 0.7–3.1)
Lymphs: 33 %
MCH: 28.6 pg (ref 26.6–33.0)
MCHC: 33.5 g/dL (ref 31.5–35.7)
MCV: 86 fL (ref 79–97)
Monocytes Absolute: 0.7 10*3/uL (ref 0.1–0.9)
Monocytes: 8 %
Neutrophils Absolute: 5 10*3/uL (ref 1.4–7.0)
Neutrophils: 55 %
Platelets: 252 10*3/uL (ref 150–450)
RBC: 4.47 x10E6/uL (ref 3.77–5.28)
RDW: 13.2 % (ref 11.7–15.4)
WBC: 9 10*3/uL (ref 3.4–10.8)

## 2019-10-02 LAB — LIPID PANEL
Chol/HDL Ratio: 3.6 ratio (ref 0.0–4.4)
Cholesterol, Total: 196 mg/dL (ref 100–199)
HDL: 54 mg/dL (ref >39)
LDL Chol Calc (NIH): 116 mg/dL — ABNORMAL HIGH (ref 0–99)
Triglycerides: 149 mg/dL (ref 0–149)
VLDL Cholesterol Cal: 26 mg/dL (ref 5–40)

## 2019-10-02 LAB — TSH: TSH: 2.96 u[IU]/mL (ref 0.450–4.500)

## 2019-10-07 ENCOUNTER — Other Ambulatory Visit: Payer: Self-pay | Admitting: Family Medicine

## 2019-10-15 ENCOUNTER — Telehealth (INDEPENDENT_AMBULATORY_CARE_PROVIDER_SITE_OTHER): Payer: Self-pay | Admitting: Gastroenterology

## 2019-10-15 ENCOUNTER — Other Ambulatory Visit: Payer: Self-pay

## 2019-10-15 DIAGNOSIS — Z1211 Encounter for screening for malignant neoplasm of colon: Secondary | ICD-10-CM

## 2019-10-15 MED ORDER — NA SULFATE-K SULFATE-MG SULF 17.5-3.13-1.6 GM/177ML PO SOLN: 1.0000 | Freq: Once | ORAL | 0 refills | Status: AC

## 2019-10-15 NOTE — Progress Notes (Signed)
Gastroenterology Pre-Procedure Review  Request Date: 11/06/19 Requesting Physician: Dr. Adolphus Birchwood  PATIENT REVIEW QUESTIONS: The patient responded to the following health history questions as indicated:    1. Are you having any GI issues? history of diverticulosis.  experiencing back pressure however the pain is relieved after going to the bathroom.  Advised if her symptoms continue or worsen she may call our office to schedule an office visit. 2. Do you have a personal history of Polyps? no 3. Do you have a family history of Colon Cancer or Polyps? no 4. Diabetes Mellitus? no 5. Joint replacements in the past 12 months?no 6. Major health problems in the past 3 months?no 7. Any artificial heart valves, MVP, or defibrillator?no    MEDICATIONS & ALLERGIES:    Patient reports the following regarding taking any anticoagulation/antiplatelet therapy:   Plavix, Coumadin, Eliquis, Xarelto, Lovenox, Pradaxa, Brilinta, or Effient? no Aspirin? yes (81 mg daily)  Patient confirms/reports the following medications:  Current Outpatient Medications  Medication Sig Dispense Refill  . acetaminophen (TYLENOL) 325 MG tablet Take by mouth.    . ALPRAZolam (XANAX) 0.25 MG tablet Take 1 tablet (0.25 mg total) by mouth at bedtime as needed for anxiety. 30 tablet 5  . aspirin (ASPIRIN 81) 81 MG chewable tablet Chew by mouth daily.    Marland Kitchen levothyroxine (SYNTHROID) 75 MCG tablet TAKE 1 TABLET BY MOUTH ONCE A DAY 90 tablet 3  . Multiple Vitamins-Minerals (CENTRUM SILVER) CHEW Chew by mouth.    . Multiple Vitamins-Minerals (ZINC PO) Take 1 tablet by mouth.    . nitrofurantoin, macrocrystal-monohydrate, (MACROBID) 100 MG capsule Take 1 capsule (100 mg total) by mouth 2 (two) times daily. 10 capsule 0  . Omega-3 Fatty Acids (FISH OIL) 1000 MG CAPS Take by mouth.    Marland Kitchen omeprazole (PRILOSEC) 20 MG capsule Take 20 mg by mouth daily.    . ranitidine (ZANTAC) 150 MG tablet Take 1 tablet (150 mg total) by mouth 2 (two)  times daily. 60 tablet 12  . vitamin E (VITAMIN E) 400 UNIT capsule Take 400 Units by mouth daily.    Marland Kitchen ibuprofen (ADVIL,MOTRIN) 800 MG tablet Take by mouth.    . Na Sulfate-K Sulfate-Mg Sulf 17.5-3.13-1.6 GM/177ML SOLN Take 1 kit by mouth once for 1 dose. 354 mL 0   No current facility-administered medications for this visit.    Patient confirms/reports the following allergies:  Allergies  Allergen Reactions  . Sertraline Hcl     uncontrollable bowels, grinding and clinching teeth.    No orders of the defined types were placed in this encounter.   AUTHORIZATION INFORMATION Primary Insurance: 1D#: Group #:  Secondary Insurance: 1D#: Group #:  SCHEDULE INFORMATION: Date: Thursday 11/06/19 Time: Location: [place]

## 2019-10-15 NOTE — Progress Notes (Signed)
Gastroenterology Pre-Procedure Review  Request Date: Thursday 11/06/19 Requesting Physician: Dr. Bonna Gains  PATIENT REVIEW QUESTIONS: The patient responded to the following health history questions as indicated:    1. Are you having any GI issues? no 2. Do you have a personal history of Polyps? no 3. Do you have a family history of Colon Cancer or Polyps? no 4. Diabetes Mellitus? no 5. Joint replacements in the past 12 months?no 6. Major health problems in the past 3 months?no 7. Any artificial heart valves, MVP, or defibrillator?no    MEDICATIONS & ALLERGIES:    Patient reports the following regarding taking any anticoagulation/antiplatelet therapy:   Plavix, Coumadin, Eliquis, Xarelto, Lovenox, Pradaxa, Brilinta, or Effient? no Aspirin? YES 81 MG DAILY  Patient confirms/reports the following medications:  Current Outpatient Medications  Medication Sig Dispense Refill  . acetaminophen (TYLENOL) 325 MG tablet Take by mouth.    . ALPRAZolam (XANAX) 0.25 MG tablet Take 1 tablet (0.25 mg total) by mouth at bedtime as needed for anxiety. 30 tablet 5  . aspirin (ASPIRIN 81) 81 MG chewable tablet Chew by mouth daily.    Marland Kitchen ibuprofen (ADVIL,MOTRIN) 800 MG tablet Take by mouth.    . levothyroxine (SYNTHROID) 75 MCG tablet TAKE 1 TABLET BY MOUTH ONCE A DAY 90 tablet 3  . Multiple Vitamins-Minerals (CENTRUM SILVER) CHEW Chew by mouth.    . Multiple Vitamins-Minerals (ZINC PO) Take 1 tablet by mouth.    . Na Sulfate-K Sulfate-Mg Sulf 17.5-3.13-1.6 GM/177ML SOLN Take 1 kit by mouth once for 1 dose. 354 mL 0  . nitrofurantoin, macrocrystal-monohydrate, (MACROBID) 100 MG capsule Take 1 capsule (100 mg total) by mouth 2 (two) times daily. 10 capsule 0  . Omega-3 Fatty Acids (FISH OIL) 1000 MG CAPS Take by mouth.    Marland Kitchen omeprazole (PRILOSEC) 20 MG capsule Take 20 mg by mouth daily.    . ranitidine (ZANTAC) 150 MG tablet Take 1 tablet (150 mg total) by mouth 2 (two) times daily. (Patient not taking:  Reported on 10/15/2019) 60 tablet 12  . vitamin E (VITAMIN E) 400 UNIT capsule Take 400 Units by mouth daily.     No current facility-administered medications for this visit.    Patient confirms/reports the following allergies:  Allergies  Allergen Reactions  . Sertraline Hcl     uncontrollable bowels, grinding and clinching teeth.    No orders of the defined types were placed in this encounter.   AUTHORIZATION INFORMATION Primary Insurance: 1D#: Group #:  Secondary Insurance: 1D#: Group #:  SCHEDULE INFORMATION: Date: Thursday 11/06/19 Time: Location:[facility]

## 2019-11-04 ENCOUNTER — Other Ambulatory Visit: Payer: Self-pay

## 2019-11-04 ENCOUNTER — Other Ambulatory Visit
Admission: RE | Admit: 2019-11-04 | Discharge: 2019-11-04 | Disposition: A | Discharge status: Home / Self Care | Payer: Commercial Managed Care - PPO | Source: Ambulatory Visit | Attending: Gastroenterology | Admitting: Gastroenterology

## 2019-11-04 DIAGNOSIS — Z01812 Encounter for preprocedural laboratory examination: Secondary | ICD-10-CM | POA: Insufficient documentation

## 2019-11-04 DIAGNOSIS — Z20822 Contact with and (suspected) exposure to covid-19: Secondary | ICD-10-CM | POA: Insufficient documentation

## 2019-11-04 LAB — SARS CORONAVIRUS 2 (TAT 6-24 HRS): SARS Coronavirus 2: NEGATIVE

## 2019-11-06 ENCOUNTER — Ambulatory Visit: Payer: Commercial Managed Care - PPO | Admitting: Anesthesiology

## 2019-11-06 ENCOUNTER — Encounter
Admission: RE | Disposition: A | Discharge status: Home / Self Care | Payer: Self-pay | Source: Home / Self Care | Attending: Gastroenterology

## 2019-11-06 ENCOUNTER — Encounter: Payer: Self-pay | Admitting: Gastroenterology

## 2019-11-06 ENCOUNTER — Ambulatory Visit
Admission: RE | Admit: 2019-11-06 | Discharge: 2019-11-06 | Disposition: A | Discharge status: Home / Self Care | Payer: Commercial Managed Care - PPO | Attending: Gastroenterology | Admitting: Gastroenterology

## 2019-11-06 DIAGNOSIS — Z86010 Personal history of colonic polyps: Secondary | ICD-10-CM | POA: Insufficient documentation

## 2019-11-06 DIAGNOSIS — K5730 Diverticulosis of large intestine without perforation or abscess without bleeding: Secondary | ICD-10-CM | POA: Insufficient documentation

## 2019-11-06 DIAGNOSIS — Z1211 Encounter for screening for malignant neoplasm of colon: Secondary | ICD-10-CM

## 2019-11-06 DIAGNOSIS — Z888 Allergy status to other drugs, medicaments and biological substances status: Secondary | ICD-10-CM | POA: Diagnosis not present

## 2019-11-06 HISTORY — DX: Other complications of anesthesia, initial encounter: T88.59XA

## 2019-11-06 HISTORY — PX: COLONOSCOPY WITH PROPOFOL: SHX5780

## 2019-11-06 SURGERY — COLONOSCOPY WITH PROPOFOL
Anesthesia: General

## 2019-11-06 MED ORDER — PROPOFOL 500 MG/50ML IV EMUL
INTRAVENOUS | Status: DC | PRN
  Administered 2019-11-06: 155 ug/kg/min via INTRAVENOUS

## 2019-11-06 MED ORDER — PROPOFOL 500 MG/50ML IV EMUL
INTRAVENOUS | Status: AC
  Filled 2019-11-06: qty 400

## 2019-11-06 MED ORDER — LIDOCAINE HCL (CARDIAC) PF 100 MG/5ML IV SOSY
PREFILLED_SYRINGE | INTRAVENOUS | Status: DC | PRN
  Administered 2019-11-06: 100 mg via INTRAVENOUS

## 2019-11-06 MED ORDER — GLYCOPYRROLATE 0.2 MG/ML IJ SOLN
INTRAMUSCULAR | Status: AC
  Filled 2019-11-06: qty 3

## 2019-11-06 MED ORDER — SODIUM CHLORIDE 0.9 % IV SOLN
INTRAVENOUS | Status: DC
  Administered 2019-11-06: 1000 mL via INTRAVENOUS

## 2019-11-06 MED ORDER — GLYCOPYRROLATE 0.2 MG/ML IJ SOLN
INTRAMUSCULAR | Status: DC | PRN
  Administered 2019-11-06: .2 mg via INTRAVENOUS

## 2019-11-06 MED ORDER — LIDOCAINE HCL (PF) 2 % IJ SOLN
INTRAMUSCULAR | Status: AC
  Filled 2019-11-06: qty 30

## 2019-11-06 MED ORDER — PHENYLEPHRINE HCL (PRESSORS) 10 MG/ML IV SOLN
INTRAVENOUS | Status: AC
  Filled 2019-11-06: qty 1

## 2019-11-06 MED ORDER — PROPOFOL 10 MG/ML IV BOLUS
INTRAVENOUS | Status: DC | PRN
  Administered 2019-11-06: 45 mg via INTRAVENOUS
  Administered 2019-11-06: 20 mg via INTRAVENOUS

## 2019-11-06 NOTE — Op Note (Signed)
[facility] Gastroenterology Patient Name: Danielle Davenport Procedure Date: 11/06/2019 8:05 AM MRN: [id number] Account #: [id number] Date of Birth: 10-10-1955 Admit Type: Outpatient Age: 64 Room: [facility] ENDO ROOM 3 Gender: Female Note Status: Finalized Procedure:             Colonoscopy Indications:           Screening for colorectal malignant neoplasm Providers:             Delonta Yohannes B. Bonna Gains MD, MD Referring MD:          Janine Ores. Rosanna Randy, MD (Referring MD) Medicines:             Monitored Anesthesia Care Complications:         No immediate complications. Procedure:             Pre-Anesthesia Assessment:                        - Prior to the procedure, a History and Physical was                         performed, and patient medications, allergies and                         sensitivities were reviewed. The patient's tolerance                         of previous anesthesia was reviewed.                        - The risks and benefits of the procedure and the                         sedation options and risks were discussed with the                         patient. All questions were answered and informed                         consent was obtained.                        - Patient identification and proposed procedure were                         verified prior to the procedure by the physician, the                         nurse, the anesthetist and the technician. The                         procedure was verified in the pre-procedure area in                         the procedure room in the endoscopy suite.                        - ASA Grade Assessment: II - A patient with mild  systemic disease.                        - After reviewing the risks and benefits, the patient                         was deemed in satisfactory condition to undergo the                         procedure.                        After obtaining informed  consent, the colonoscope was                         passed under direct vision. Throughout the procedure,                         the patient's blood pressure, pulse, and oxygen                         saturations were monitored continuously. The                         Colonoscope was introduced through the anus and                         advanced to the the cecum, identified by appendiceal                         orifice and ileocecal valve. The colonoscopy was                         performed with ease. The patient tolerated the                         procedure well. The quality of the bowel preparation                         was good. Findings:      The perianal and digital rectal examinations were normal.      Multiple diverticula were found in the sigmoid colon.      The exam was otherwise without abnormality.      The rectum, sigmoid colon, descending colon, transverse colon, ascending       colon and cecum appeared normal.      The retroflexed view of the distal rectum and anal verge was normal and       showed no anal or rectal abnormalities. Impression:            - Diverticulosis in the sigmoid colon.                        - The examination was otherwise normal.                        - The rectum, sigmoid colon, descending colon,                         transverse colon, ascending colon and cecum are normal.                        -  The distal rectum and anal verge are normal on                         retroflexion view.                        - No specimens collected. Recommendation:        - Discharge patient to home.                        - Resume previous diet.                        - Continue present medications.                        - Repeat colonoscopy in 10 years for screening                         purposes.                        - Return to primary care physician as previously                         scheduled.                        - The findings  and recommendations were discussed with                         the patient.                        - The findings and recommendations were discussed with                         the patient's family.                        - High fiber diet. Procedure Code(s):     --- Professional ---                        [phone], Colonoscopy, flexible; diagnostic, including                         collection of specimen(s) by brushing or washing, when                         performed (separate procedure) Diagnosis Code(s):     --- Professional ---                        Z12.11, Encounter for screening for malignant neoplasm                         of colon CPT copyright 2019 American Medical Association. All rights reserved. The codes documented in this report are preliminary and upon coder review may  be revised to meet current compliance requirements.  Vonda Antigua, MD Margretta Sidle B. Bonna Gains MD, MD 11/06/2019 8:41:24 AM This report has been signed electronically. Number of Addenda: 0 Note Initiated On:  11/06/2019 8:05 AM Scope Withdrawal Time: 0 hours 11 minutes 52 seconds  Total Procedure Duration: 0 hours 15 minutes 10 seconds       [facility]

## 2019-11-06 NOTE — Anesthesia Preprocedure Evaluation (Signed)
Anesthesia Evaluation  Patient identified by MRN, date of birth, ID band Patient awake    Reviewed: Allergy & Precautions, NPO status , Patient's Chart, lab work & pertinent test results  History of Anesthesia Complications (+) PROLONGED EMERGENCE and history of anesthetic complications  Airway Mallampati: II  TM Distance: >3 FB Neck ROM: Full    Dental no notable dental hx. (+) Teeth Intact   Pulmonary neg pulmonary ROS, neg sleep apnea, neg COPD, Patient abstained from smoking.Not current smoker,    Pulmonary exam normal breath sounds clear to auscultation       Cardiovascular Exercise Tolerance: Good METS(-) hypertension+ Peripheral Vascular Disease  (-) CAD and (-) Past MI (-) dysrhythmias  Rhythm:Regular Rate:Normal - Systolic murmurs    Neuro/Psych PSYCHIATRIC DISORDERS Depression negative neurological ROS     GI/Hepatic GERD  ,(+)     (-) substance abuse  ,   Endo/Other  neg diabetes  Renal/GU negative Renal ROS     Musculoskeletal   Abdominal   Peds  Hematology   Anesthesia Other Findings Past Medical History: No date: Arthritis No date: BRCA negative No date: Colon polyp No date: Complication of anesthesia No date: Depression No date: Diverticulosis No date: Functional disorder of stomach No date: Gastroduodenal ulcer No date: GERD (gastroesophageal reflux disease) No date: Liver disorder No date: Thyroid disease     Comment:  Hypothyroid  Reproductive/Obstetrics                             Anesthesia Physical Anesthesia Plan  ASA: II  Anesthesia Plan: General   Post-op Pain Management:    Induction: Intravenous  PONV Risk Score and Plan: 3 and Ondansetron, Propofol infusion and TIVA  Airway Management Planned: Nasal Cannula  Additional Equipment: None  Intra-op Plan:   Post-operative Plan:   Informed Consent: I have reviewed the patients History and  Physical, chart, labs and discussed the procedure including the risks, benefits and alternatives for the proposed anesthesia with the patient or authorized representative who has indicated his/her understanding and acceptance.     Dental advisory given  Plan Discussed with: CRNA and Surgeon  Anesthesia Plan Comments: (Discussed risks of anesthesia with patient, including possibility of difficulty with spontaneous ventilation under anesthesia necessitating airway intervention, PONV, and rare risks such as cardiac or respiratory or neurological events. Patient understands.)        Anesthesia Quick Evaluation  

## 2019-11-06 NOTE — Transfer of Care (Signed)
Immediate Anesthesia Transfer of Care Note  Patient: Danielle Davenport  Procedure(s) Performed: COLONOSCOPY WITH PROPOFOL (N/A )  Patient Location: Endoscopy Unit  Anesthesia Type:General  Level of Consciousness: drowsy and patient cooperative  Airway & Oxygen Therapy: Patient Spontanous Breathing and Patient connected to face mask oxygen  Post-op Assessment: Report given to RN and Post -op Vital signs reviewed and stable  Post vital signs: Reviewed and stable  Last Vitals:  Vitals Value Taken Time  BP    Temp    Pulse 70 11/06/19 0842  Resp 14 11/06/19 0842  SpO2 100 % 11/06/19 0842  Vitals shown include unvalidated device data.  Last Pain:  Vitals:   11/06/19 0713  TempSrc: Temporal  PainSc: 0-No pain         Complications: No complications documented.

## 2019-11-06 NOTE — H&P (Signed)
Danielle Antigua, MD [address], [place], [place], [address] [address], [place], [place], [place], [address] Phone: [phone]  Fax: [phone]  Primary Care Physician:  Jerrol Banana., MD   Pre-Procedure History & Physical: HPI:  Danielle Davenport is a 64 y.o. female is here for a colonoscopy.   Past Medical History:  Diagnosis Date  . Arthritis   . BRCA negative   . Colon polyp   . Complication of anesthesia   . Depression   . Diverticulosis   . Functional disorder of stomach   . Gastroduodenal ulcer   . GERD (gastroesophageal reflux disease)   . Liver disorder   . Thyroid disease    Hypothyroid    Past Surgical History:  Procedure Laterality Date  . BREAST EXCISIONAL BIOPSY Left 2000   benign  . BREAST SURGERY    . HYSTEROSCOPY    . INCONTINENCE SURGERY    . TUBAL LIGATION    . UTERINE FIBROID SURGERY      Prior to Admission medications   Medication Sig Start Date End Date Taking? Authorizing Provider  aspirin (ASPIRIN 81) 81 MG chewable tablet Chew by mouth daily.   Yes [provider]  levothyroxine (SYNTHROID) 75 MCG tablet TAKE 1 TABLET BY MOUTH ONCE A DAY 10/07/19  Yes Jerrol Banana., MD  Multiple Vitamins-Minerals (CENTRUM SILVER) CHEW Chew by mouth.   Yes [provider]  Multiple Vitamins-Minerals (ZINC PO) Take 1 tablet by mouth.   Yes [provider]  Omega-3 Fatty Acids (FISH OIL) 1000 MG CAPS Take by mouth.   Yes [provider]  omeprazole (PRILOSEC) 20 MG capsule Take 20 mg by mouth daily.   Yes [provider]  acetaminophen (TYLENOL) 325 MG tablet Take by mouth.    [provider]  ALPRAZolam Duanne Moron) 0.25 MG tablet Take 1 tablet (0.25 mg total) by mouth at bedtime as needed for anxiety. 10/01/19   Jerrol Banana., MD  ibuprofen (ADVIL,MOTRIN) 800 MG tablet Take by mouth.    [provider]  nitrofurantoin, macrocrystal-monohydrate,  (MACROBID) 100 MG capsule Take 1 capsule (100 mg total) by mouth 2 (two) times daily. Patient not taking: Reported on 11/06/2019 10/24/18   Jerrol Banana., MD  ranitidine (ZANTAC) 150 MG tablet Take 1 tablet (150 mg total) by mouth 2 (two) times daily. Patient not taking: Reported on 10/15/2019 06/25/14   Jerrol Banana., MD  vitamin E (VITAMIN E) 400 UNIT capsule Take 400 Units by mouth daily.    [provider]    Allergies as of 10/15/2019 - Review Complete 10/15/2019  Allergen Reaction Noted  . Sertraline hcl  08/29/2016    Family History  Problem Relation Age of Onset  . Breast cancer Mother 86  . Brain cancer Mother   . Cirrhosis Father   . COPD Brother   . COPD Daughter   . Emphysema Maternal Grandfather   . Stroke Paternal Grandfather   . Brain cancer Sister   . Breast cancer Maternal Aunt   . Stroke Maternal Uncle     Social History   Socioeconomic History  . Marital status: Married    Spouse name: Danielle Davenport  . Number of children: 2  . Years of education: HS  . Highest education level: Not on file  Occupational History  . Occupation:       WORK    Employer: [employer]  Tobacco Use  . Smoking status:  Never Smoker  . Smokeless tobacco: Never Used  Vaping Use  . Vaping Use: Never used  Substance and Sexual Activity  . Alcohol use: No    Alcohol/week: 0.0 standard drinks  . Drug use: No  . Sexual activity: Yes    Birth control/protection: Post-menopausal  Other Topics Concern  . Not on file  Social History Narrative  . Not on file   Social Determinants of Health   Financial Resource Strain:   . Difficulty of Paying Living Expenses: Not on file  Food Insecurity:   . Worried About Charity fundraiser in the Last Year: Not on file  . Ran Out of Food in the Last Year: Not on file  Transportation Needs:   . Lack of Transportation (Medical): Not on file  . Lack of Transportation (Non-Medical): Not on file  Physical Activity:   .  Days of Exercise per Week: Not on file  . Minutes of Exercise per Session: Not on file  Stress:   . Feeling of Stress : Not on file  Social Connections:   . Frequency of Communication with Friends and Family: Not on file  . Frequency of Social Gatherings with Friends and Family: Not on file  . Attends Religious Services: Not on file  . Active Member of Clubs or Organizations: Not on file  . Attends Archivist Meetings: Not on file  . Marital Status: Not on file  Intimate Partner Violence:   . Fear of Current or Ex-Partner: Not on file  . Emotionally Abused: Not on file  . Physically Abused: Not on file  . Sexually Abused: Not on file    Review of Systems: See HPI, otherwise negative ROS  Physical Exam: BP (!) 150/86   Pulse 65   Temp (!) 96.5 F (35.8 C) (Temporal)   Resp 18   Ht 5' 4"  (1.626 m)   Wt 95.5 kg   SpO2 100%   BMI 36.12 kg/m  General:   Alert,  pleasant and cooperative in NAD Head:  Normocephalic and atraumatic. Neck:  Supple; no masses or thyromegaly. Lungs:  Clear throughout to auscultation, normal respiratory effort.    Heart:  +S1, +S2, Regular rate and rhythm, No edema. Abdomen:  Soft, nontender and nondistended. Normal bowel sounds, without guarding, and without rebound.   Neurologic:  Alert and  oriented x4;  grossly normal neurologically.  Impression/Plan: [name] is here for a colonoscopy to be performed for average risk screening.  Risks, benefits, limitations, and alternatives regarding  colonoscopy have been reviewed with the patient.  Questions have been answered.  All parties agreeable.   Virgel Manifold, MD  11/06/2019, 8:12 AM

## 2019-11-06 NOTE — Anesthesia Procedure Notes (Signed)
Procedure Name: General with mask airway Performed by: Fletcher-Harrison, Eleaner Dibartolo, CRNA Pre-anesthesia Checklist: Patient identified, Emergency Drugs available, Suction available and Patient being monitored Patient Re-evaluated:Patient Re-evaluated prior to induction Oxygen Delivery Method: Simple face mask Induction Type: IV induction Placement Confirmation: positive ETCO2 and CO2 detector Dental Injury: Teeth and Oropharynx as per pre-operative assessment        

## 2019-11-06 NOTE — Anesthesia Postprocedure Evaluation (Signed)
Anesthesia Post Note  Patient: Danielle Davenport  Procedure(s) Performed: COLONOSCOPY WITH PROPOFOL (N/A )  Patient location during evaluation: Endoscopy Anesthesia Type: General Level of consciousness: awake and alert Pain management: pain level controlled Vital Signs Assessment: post-procedure vital signs reviewed and stable Respiratory status: spontaneous breathing, nonlabored ventilation, respiratory function stable and patient connected to nasal cannula oxygen Cardiovascular status: blood pressure returned to baseline and stable Postop Assessment: no apparent nausea or vomiting Anesthetic complications: no   No complications documented.   Last Vitals:  Vitals:   11/06/19 0841 11/06/19 0901  BP:  138/79  Pulse:    Resp:    Temp: (!) 35.8 C   SpO2:      Last Pain:  Vitals:   11/06/19 0911  TempSrc:   PainSc: 0-No pain                 Arita Miss

## 2019-11-10 ENCOUNTER — Encounter: Payer: Self-pay | Admitting: Gastroenterology

## 2019-12-24 ENCOUNTER — Other Ambulatory Visit (HOSPITAL_COMMUNITY)
Admission: RE | Admit: 2019-12-24 | Discharge: 2019-12-24 | Disposition: A | Discharge status: Home / Self Care | Payer: Commercial Managed Care - PPO | Source: Ambulatory Visit | Attending: Obstetrics & Gynecology | Admitting: Obstetrics & Gynecology

## 2019-12-24 ENCOUNTER — Ambulatory Visit (INDEPENDENT_AMBULATORY_CARE_PROVIDER_SITE_OTHER): Payer: Commercial Managed Care - PPO | Admitting: Obstetrics & Gynecology

## 2019-12-24 ENCOUNTER — Encounter: Payer: Self-pay | Admitting: Obstetrics & Gynecology

## 2019-12-24 ENCOUNTER — Other Ambulatory Visit: Payer: Self-pay

## 2019-12-24 VITALS — BP 138/82 | Ht 63.0 in | Wt 209.0 lb

## 2019-12-24 DIAGNOSIS — Z124 Encounter for screening for malignant neoplasm of cervix: Secondary | ICD-10-CM | POA: Insufficient documentation

## 2019-12-24 DIAGNOSIS — R1031 Right lower quadrant pain: Secondary | ICD-10-CM | POA: Diagnosis not present

## 2019-12-24 DIAGNOSIS — Z01419 Encounter for gynecological examination (general) (routine) without abnormal findings: Secondary | ICD-10-CM | POA: Diagnosis not present

## 2019-12-24 DIAGNOSIS — Z1231 Encounter for screening mammogram for malignant neoplasm of breast: Secondary | ICD-10-CM | POA: Diagnosis not present

## 2019-12-24 NOTE — Patient Instructions (Addendum)
PAP every three years (today) Mammogram every year    Call [phone] to schedule at [facility] Colonoscopy every 5 or 10 years (up to date!) Labs yearly (with PCP)  Thank you for choosing [facility]. As part of our ongoing efforts to improve patient experience, we would appreciate your feedback. Please fill out the short survey that you will receive by mail or MyChart. Your opinion is important to [place]! - Dr. Kenton Kingfisher  Recommendations to boost your immunity to prevent illness such as viral flu and colds, including covid19, are as follows: Vitamin K2 and Vitamin D3. Take Vitamin K2 at 200-300 mcg daily (usually 2-3 pills daily of the over the counter formulation). Take Vitamin D3 at 3000-4000 U daily (usually 3-4 pills daily of the over the counter formulation). Studies show that these two at high normal levels in your system are very effective in keeping your immunity so strong and protective that you will be unlikely to contract viral illness such as those listed above.  Dr Kenton Kingfisher

## 2019-12-24 NOTE — Progress Notes (Signed)
HPI:      [name] is a 64 y.o. [id number] who LMP was No LMP recorded. Patient is postmenopausal., she presents today for her annual examination. The patient has no complaints today other than RLQ and SIDE pain for the last 2-3 mos.  No GI sx's, and normal colonoscopy 3 mos ago.  No VB or vag d/c; bladder without incontinence or pain.  Her last pap: approximate date 2018 and was normal and last mammogram: approximate date 2020 and was normal. The patient does perform self breast exams.  There is no notable family history of breast or ovarian cancer in her family.  The patient has regular exercise: yes.  The patient denies current symptoms of depression.     GYN History: Colonoscopy UTD 2021  PMHx: Past Medical History:  Diagnosis Date   Arthritis    BRCA negative    Colon polyp    Complication of anesthesia    Depression    Diverticulosis    Functional disorder of stomach    Gastroduodenal ulcer    GERD (gastroesophageal reflux disease)    Liver disorder    Thyroid disease    Hypothyroid   Past Surgical History:  Procedure Laterality Date   BREAST EXCISIONAL BIOPSY Left 2000   benign   BREAST SURGERY     COLONOSCOPY WITH PROPOFOL N/A 11/06/2019   Procedure: COLONOSCOPY WITH PROPOFOL;  Surgeon: Virgel Manifold, MD;  Location: [facility] ENDOSCOPY;  Service: Endoscopy;  Laterality: N/A;   HYSTEROSCOPY     INCONTINENCE SURGERY     TUBAL LIGATION     UTERINE FIBROID SURGERY     Family History  Problem Relation Age of Onset   Breast cancer Mother 4   Brain cancer Mother    Cirrhosis Father    COPD Brother    COPD Daughter    Emphysema Maternal Grandfather    Stroke Paternal Grandfather    Brain cancer Sister    Breast cancer Maternal Aunt    Stroke Maternal Uncle    Social History   Tobacco Use   Smoking status: Never Smoker   Smokeless tobacco: Never Used  Vaping Use   Vaping Use: Never used  Substance Use Topics    Alcohol use: No    Alcohol/week: 0.0 standard drinks   Drug use: No    Current Outpatient Medications:    acetaminophen (TYLENOL) 325 MG tablet, Take by mouth., Disp: , Rfl:    ALPRAZolam (XANAX) 0.25 MG tablet, Take 1 tablet (0.25 mg total) by mouth at bedtime as needed for anxiety., Disp: 30 tablet, Rfl: 5   aspirin 81 MG chewable tablet, Chew by mouth daily., Disp: , Rfl:    ibuprofen (ADVIL,MOTRIN) 800 MG tablet, Take by mouth., Disp: , Rfl:    levothyroxine (SYNTHROID) 75 MCG tablet, TAKE 1 TABLET BY MOUTH ONCE A DAY, Disp: 90 tablet, Rfl: 3   Multiple Vitamins-Minerals (CENTRUM SILVER) CHEW, Chew by mouth., Disp: , Rfl:    Multiple Vitamins-Minerals (ZINC PO), Take 1 tablet by mouth., Disp: , Rfl:    Omega-3 Fatty Acids (FISH OIL) 1000 MG CAPS, Take by mouth., Disp: , Rfl:    omeprazole (PRILOSEC) 20 MG capsule, Take 20 mg by mouth daily., Disp: , Rfl:    ranitidine (ZANTAC) 150 MG tablet, Take 1 tablet (150 mg total) by mouth 2 (two) times daily., Disp: 60 tablet, Rfl: 12   vitamin E 180 MG (400 UNITS) capsule, Take 400 Units by mouth daily., Disp: ,  Rfl:    nitrofurantoin, macrocrystal-monohydrate, (MACROBID) 100 MG capsule, Take 1 capsule (100 mg total) by mouth 2 (two) times daily. (Patient not taking: No sig reported), Disp: 10 capsule, Rfl: 0 Allergies: Sertraline hcl  Review of Systems  Constitutional: Negative for chills, fever and malaise/fatigue.  HENT: Negative for congestion, sinus pain and sore throat.   Eyes: Negative for blurred vision and pain.  Respiratory: Negative for cough and wheezing.   Cardiovascular: Negative for chest pain and leg swelling.  Gastrointestinal: Positive for abdominal pain. Negative for constipation, diarrhea, heartburn, nausea and vomiting.  Genitourinary: Positive for flank pain. Negative for dysuria, frequency, hematuria and urgency.  Musculoskeletal: Negative for back pain, joint pain, myalgias and neck pain.  Skin:  Negative for itching and rash.  Neurological: Negative for dizziness, tremors and weakness.  Endo/Heme/Allergies: Does not bruise/bleed easily.  Psychiatric/Behavioral: Negative for depression. The patient is not nervous/anxious and does not have insomnia.     Objective: BP 138/82    Ht _0  (1.6 m)    Wt 209 lb (94.8 kg)    BMI 37.02 kg/m   Filed Weights   12/24/19 0902  Weight: 209 lb (94.8 kg)   Body mass index is 37.02 kg/m. Physical Exam Constitutional:      General: She is not in acute distress.    Appearance: She is well-developed and well-nourished.  Genitourinary:     Vagina, uterus and rectum normal.     There is no rash or lesion on the right labia.     There is no rash or lesion on the left labia.    No lesions in the vagina.     No vaginal bleeding.      Right Adnexa: tender.    Right Adnexa: no mass present.    Left Adnexa: not tender and no mass present.    No cervical motion tenderness, friability, lesion or polyp.     Uterus is mobile.     Uterus is not enlarged or tender.     No uterine mass detected.    Uterus is midaxial.     Pelvic exam was performed with patient supine.  Breasts:     Right: No mass, skin change or tenderness.     Left: No mass, skin change or tenderness.    HENT:     Head: Normocephalic and atraumatic. No laceration.     Right Ear: Hearing normal.     Left Ear: Hearing normal.     Nose: No epistaxis or foreign body.     Mouth/Throat:     Mouth: Oropharynx is clear and moist and mucous membranes are normal.     Pharynx: Uvula midline.  Eyes:     Pupils: Pupils are equal, round, and reactive to light.  Neck:     Thyroid: No thyromegaly.  Cardiovascular:     Rate and Rhythm: Normal rate and regular rhythm.     Heart sounds: No murmur heard. No friction rub. No gallop.   Pulmonary:     Effort: Pulmonary effort is normal. No respiratory distress.     Breath sounds: Normal breath sounds. No wheezing.  Abdominal:     General:  Bowel sounds are normal. There is no distension.     Palpations: Abdomen is soft.     Tenderness: There is no abdominal tenderness. There is no rebound.  Musculoskeletal:        General: Normal range of motion.     Cervical back: Normal range of motion  and neck supple.  Neurological:     Mental Status: She is alert and oriented to person, place, and time.     Cranial Nerves: No cranial nerve deficit.  Skin:    General: Skin is warm and dry.  Psychiatric:        Mood and Affect: Mood and affect normal.        Judgment: Judgment normal.  Vitals reviewed.     Assessment:  ANNUAL EXAM 1. Women's annual routine gynecological examination   2. Encounter for screening mammogram for malignant neoplasm of breast   3. Screening for cervical cancer   4. RLQ abdominal pain              1.  Cervical Screening-  Pap smear done today  2. Breast screening- Exam annually and mammogram>40 planned   3. Colonoscopy every 10 years, Hemoccult testing - after age 57  4. Labs managed by PCP   5. RLQ pain, plan [place] to assess adnexa. Likely MS etiology.  NSAIDs.    F/U  Return in about 2 weeks (around 01/07/2020) for Follow up w GYN [place].  Barnett Applebaum, MD, [facility], [facility] 12/24/2019  9:37 AM

## 2019-12-26 LAB — CYTOLOGY - PAP
Comment: NEGATIVE
Diagnosis: NEGATIVE
Diagnosis: REACTIVE
High risk HPV: NEGATIVE

## 2020-01-07 ENCOUNTER — Ambulatory Visit (INDEPENDENT_AMBULATORY_CARE_PROVIDER_SITE_OTHER): Payer: Commercial Managed Care - PPO

## 2020-01-07 ENCOUNTER — Ambulatory Visit (INDEPENDENT_AMBULATORY_CARE_PROVIDER_SITE_OTHER): Payer: Commercial Managed Care - PPO | Admitting: Obstetrics & Gynecology

## 2020-01-07 ENCOUNTER — Ambulatory Visit
Admission: RE | Admit: 2020-01-07 | Discharge: 2020-01-07 | Disposition: A | Discharge status: Home / Self Care | Payer: Commercial Managed Care - PPO | Source: Ambulatory Visit | Attending: Obstetrics & Gynecology | Admitting: Obstetrics & Gynecology

## 2020-01-07 ENCOUNTER — Encounter: Payer: Self-pay | Admitting: Obstetrics & Gynecology

## 2020-01-07 ENCOUNTER — Other Ambulatory Visit: Payer: Self-pay

## 2020-01-07 VITALS — BP 138/80 | Ht 63.0 in | Wt 208.0 lb

## 2020-01-07 DIAGNOSIS — Z1231 Encounter for screening mammogram for malignant neoplasm of breast: Secondary | ICD-10-CM | POA: Diagnosis not present

## 2020-01-07 DIAGNOSIS — R1031 Right lower quadrant pain: Secondary | ICD-10-CM | POA: Diagnosis not present

## 2020-01-07 DIAGNOSIS — D219 Benign neoplasm of connective and other soft tissue, unspecified: Secondary | ICD-10-CM | POA: Diagnosis not present

## 2020-01-07 DIAGNOSIS — M25551 Pain in right hip: Secondary | ICD-10-CM | POA: Diagnosis not present

## 2020-01-07 NOTE — Progress Notes (Signed)
HPI: Pt continues to c/o right sided pain, more in hip area and worse w movement.  Denies vag bleeding, bladder pressure, dysuria, frequency, nocturia, pain or bleeding w BMs.  No leg numbness or weakness.  Ultrasound demonstrates fibroids and slight endometrial thickening Fibroid 1:9.5 x 9.3 x 13.7 mm subserosal right Fibroid 2:7.0 x 3.2 x 5.8 mm intramural posterior Fibroid 3: 6.2 x 3.2 x 8.8 mm subserosal anterior The Endometrium measures 5.8 mm. It is irregular and complex in appearance. Endometrial polyps can not be ruled out.   PMHx: She  has a past medical history of Arthritis, BRCA negative, Colon polyp, Complication of anesthesia, Depression, Diverticulosis, Functional disorder of stomach, Gastroduodenal ulcer, GERD (gastroesophageal reflux disease), Liver disorder, and Thyroid disease. Also,  has a past surgical history that includes Tubal ligation; Incontinence surgery; Uterine fibroid surgery; Hysteroscopy; Breast excisional biopsy (Left, 2000); Breast surgery; and Colonoscopy with propofol (N/A, 11/06/2019)., family history includes Brain cancer in her mother and sister; Breast cancer in her maternal aunt; Breast cancer (age of onset: 68) in her mother; COPD in her brother and daughter; Cirrhosis in her father; Emphysema in her maternal grandfather; Stroke in her maternal uncle and paternal grandfather.,  reports that she has never smoked. She has never used smokeless tobacco. She reports that she does not drink alcohol and does not use drugs.  She has a current medication list which includes the following prescription(s): acetaminophen, alprazolam, aspirin, ibuprofen, levothyroxine, centrum silver, multiple vitamins-minerals, fish oil, omeprazole, ranitidine, vitamin e, and nitrofurantoin (macrocrystal-monohydrate). Also, is allergic to sertraline hcl.  ROS  Objective: BP 138/80   Ht _0  (1.6 m)   Wt 208 lb (94.3 kg)   BMI 36.85 kg/m   Physical examination Constitutional  NAD, Conversant  Skin No rashes, lesions or ulceration.   Extremities: Moves all appropriately.  Normal ROM for age. No lymphadenopathy.  Neuro: Grossly intact  Psych: Oriented to PPT.  Normal mood. Normal affect.   US PELVIC COMPLETE WITH TRANSVAGINAL  Result Date: 01/07/2020 Patient Name: Danielle Davenport DOB: [birth year] MRN: [id number] ULTRASOUND REPORT Location: [facility] Date of Service: 01/07/2020 Indications:Pelvic Pain Findings: The uterus is anteverted and measures 7.4 x 4.0 x 2.8 cm. Echo texture is heterogenous with evidence of focal masses. Within the uterus are multiple suspected fibroids measuring: Fibroid 1:9.5 x 9.3 x 13.7 mm subserosal right Fibroid 2:7.0 x 3.2 x 5.8 mm intramural posterior Fibroid 3: 6.2 x 3.2 x 8.8 mm subserosal anterior The Endometrium measures 5.8 mm. It is irregular and complex in appearance. Endometrial polyps can not be ruled out. Nabothian cysts in the cervix. Right Ovary measures 2.0 x 1.0 x 1.4 cm. It is normal in appearance. Left Ovary measures 2.8 x 1.8 x 1.9 cm. It is normal in appearance. Survey of the adnexa demonstrates no adnexal masses. There is no free fluid in the cul de sac. Impression: 1. There are nabothian cysts in the cervix. 2. There are at least three uterine fibroids seen. 3. The endometrial lining is irregularly thick. 4. Endometrial polyps can not be ruled out. 5. There is a small amount of free fluid within the endometrial canal, 3.6 mm AP. 6. Normal appearing ovaries. Recommendations: 1.Clinical correlation with the patient's History and Physical Exam. Gweneth Dimitri, RT Review of ULTRASOUND.    I have personally reviewed images and report of recent ultrasound done at [facility].    Plan of management to be discussed with patient. Barnett Applebaum, MD, [facility], [place]  01/07/2020  2:48 PM   Assessment:  RLQ abdominal pain Fibroid Right hip pain - Plan: Ambulatory referral to Orthopedic  Surgery  Low likelihood for fibroid as etiology for her pain de to small size and likely longevity of them being present.  No bleeding or other fibroid sx's.  Pros and cons of hysterectomy to alleviate fibroids and possible source of pain discussed.  Prefer to explore all other possibilities first.  Has seen GI and has had colonoscopy 1 month ago. Next step would be orthopedics and/or gen surgery.  A total of 20 minutes were spent face-to-face with the patient as well as preparation, review, communication, and documentation during this encounter.   Barnett Applebaum, MD, [facility], [facility] 01/07/2020  3:01 PM

## 2020-03-30 ENCOUNTER — Encounter: Payer: Self-pay | Admitting: Family Medicine

## 2020-03-30 ENCOUNTER — Ambulatory Visit (INDEPENDENT_AMBULATORY_CARE_PROVIDER_SITE_OTHER): Payer: Commercial Managed Care - PPO | Admitting: Family Medicine

## 2020-03-30 DIAGNOSIS — J069 Acute upper respiratory infection, unspecified: Secondary | ICD-10-CM | POA: Diagnosis not present

## 2020-03-30 DIAGNOSIS — R059 Cough, unspecified: Secondary | ICD-10-CM | POA: Diagnosis not present

## 2020-03-30 NOTE — Progress Notes (Signed)
Virtual telephone visit    Virtual Visit via Telephone Note   This visit type was conducted due to national recommendations for restrictions regarding the COVID-19 Pandemic (e.g. social distancing) in an effort to limit this patient's exposure and mitigate transmission in our community. Due to her co-morbid illnesses, this patient is at least at moderate risk for complications without adequate follow up. This format is felt to be most appropriate for this patient at this time. The patient did not have access to video technology or had technical difficulties with video requiring transitioning to audio format only (telephone). Physical exam was limited to content and character of the telephone converstion.    Patient location: Home Provider location: Office  I discussed the limitations of evaluation and management by telemedicine and the availability of in person appointments. The patient expressed understanding and agreed to proceed.   Visit Date: 03/30/2020  Today's healthcare provider: Wilhemena Durie, MD   No chief complaint on file.  Subjective    HPI   2 days of cough, sinus congestion, ears popping, postnasal drainage.  Mild shortness of breath with a cough. No fevers or myalgias.  She is fully vaccinated against Covid and had a negative rapid test 2 days ago.     Medications: Outpatient Medications Prior to Visit  Medication Sig  . acetaminophen (TYLENOL) 325 MG tablet Take by mouth.  . ALPRAZolam (XANAX) 0.25 MG tablet Take 1 tablet (0.25 mg total) by mouth at bedtime as needed for anxiety.  Marland Kitchen aspirin 81 MG chewable tablet Chew by mouth daily.  Marland Kitchen ibuprofen (ADVIL,MOTRIN) 800 MG tablet Take by mouth.  . levothyroxine (SYNTHROID) 75 MCG tablet TAKE 1 TABLET BY MOUTH ONCE A DAY  . Multiple Vitamins-Minerals (CENTRUM SILVER) CHEW Chew by mouth.  . Multiple Vitamins-Minerals (ZINC PO) Take 1 tablet by mouth.  . Omega-3 Fatty Acids (FISH OIL) 1000 MG CAPS Take by  mouth.  Marland Kitchen omeprazole (PRILOSEC) 20 MG capsule Take 20 mg by mouth daily.  . vitamin E 180 MG (400 UNITS) capsule Take 400 Units by mouth daily.  . nitrofurantoin, macrocrystal-monohydrate, (MACROBID) 100 MG capsule Take 1 capsule (100 mg total) by mouth 2 (two) times daily. (Patient not taking: No sig reported)  . ranitidine (ZANTAC) 150 MG tablet Take 1 tablet (150 mg total) by mouth 2 (two) times daily. (Patient not taking: Reported on 03/30/2020)   No facility-administered medications prior to visit.    Review of Systems     Objective    There were no vitals taken for this visit. Wt Readings from Last 3 Encounters:  01/07/20 208 lb (94.3 kg)  12/24/19 209 lb (94.8 kg)  11/06/19 210 lb 6.9 oz (95.5 kg)   Patient has head congestion during the conversation and her nose is dripping in but she is alert and oriented and in no respiratory distress.  No audible wheezing.    Assessment & Plan     1. Viral upper respiratory tract infection Almost certainly viral but non-Covid. Patient to get a Covid PCR test. I think she has a viral sinusitis and patient was told to stay out of work if this is the case. Antibiotics not indicated at this time. We will give her an work note to stay out until Monday  2. Cough Try Robitussin-DM twice a day for the cough.  Offered an inhaler and she declines.   No follow-ups on file.    I discussed the assessment and treatment plan with the patient. The  patient was provided an opportunity to ask questions and all were answered. The patient agreed with the plan and demonstrated an understanding of the instructions.   The patient was advised to call back or seek an in-person evaluation if the symptoms worsen or if the condition fails to improve as anticipated.  I provided 11 minutes of non-face-to-face time during this encounter.    Keveon Amsler Cranford Mon, MD [facility] [phone] (phone) [phone] (fax)  [place]

## 2020-03-31 ENCOUNTER — Telehealth: Payer: Self-pay

## 2020-03-31 ENCOUNTER — Encounter: Payer: Self-pay | Admitting: *Deleted

## 2020-03-31 NOTE — Telephone Encounter (Signed)
Copied from [place] [id number]. Topic: Appointment Scheduling - Scheduling Inquiry for Clinic >> Mar 31, 2020  8:10 AM Oneta Rack wrote: Reason for CRM:  Patient had a 3:40pm appointment with PCP and stated she was advised she was going to receive a phone call. Patient states she spoke with the nurse but never received a call from PCP. Patient would like to be worked in today, please follow up.

## 2020-09-21 ENCOUNTER — Other Ambulatory Visit: Payer: Self-pay | Admitting: Family Medicine

## 2020-09-21 ENCOUNTER — Telehealth: Payer: Self-pay

## 2020-09-21 DIAGNOSIS — U071 COVID-19: Secondary | ICD-10-CM

## 2020-09-21 MED ORDER — MOLNUPIRAVIR EUA 200MG CAPSULE
4.0000 | ORAL_CAPSULE | Freq: Two times a day (BID) | ORAL | 0 refills | Status: AC
Start: 2020-09-21 — End: 2020-09-26

## 2020-09-21 NOTE — Telephone Encounter (Signed)
Copied from [place] [id number]. Topic: General - Other >> Sep 21, 2020  8:33 AM Tessa Lerner A wrote: Reason for CRM: Patient tested positive for COVID 19 this morning 09/21/20  The patient shares that they're experiencing cold and flu like symptoms as well as coughing, congestion and body aches  The patient would like to be prescribed something for discomfort when possible  Please contact further if needed

## 2020-09-21 NOTE — Telephone Encounter (Signed)
Last GFR was 90 last checked 09/29/2019. She has this checked annually and it has been stable 80-90. Next check is 10/04/20. Please advise. Thanks!

## 2020-09-21 NOTE — Telephone Encounter (Signed)
Advised patient as below. Medication sent into the pharmacy.

## 2020-10-04 ENCOUNTER — Other Ambulatory Visit: Payer: Self-pay

## 2020-10-04 ENCOUNTER — Encounter: Payer: Self-pay | Admitting: Family Medicine

## 2020-10-04 ENCOUNTER — Ambulatory Visit (INDEPENDENT_AMBULATORY_CARE_PROVIDER_SITE_OTHER): Payer: Commercial Managed Care - PPO | Admitting: Family Medicine

## 2020-10-04 VITALS — BP 146/87 | HR 76 | Temp 98.0°F | Resp 16 | Ht 63.0 in | Wt 208.0 lb

## 2020-10-04 DIAGNOSIS — Z23 Encounter for immunization: Secondary | ICD-10-CM

## 2020-10-04 DIAGNOSIS — I890 Lymphedema, not elsewhere classified: Secondary | ICD-10-CM

## 2020-10-04 DIAGNOSIS — E6609 Other obesity due to excess calories: Secondary | ICD-10-CM

## 2020-10-04 DIAGNOSIS — I83813 Varicose veins of bilateral lower extremities with pain: Secondary | ICD-10-CM

## 2020-10-04 DIAGNOSIS — Z0000 Encounter for general adult medical examination without abnormal findings: Secondary | ICD-10-CM | POA: Diagnosis not present

## 2020-10-04 DIAGNOSIS — Z6837 Body mass index (BMI) 37.0-37.9, adult: Secondary | ICD-10-CM

## 2020-10-04 NOTE — Progress Notes (Signed)
Complete physical exam   Patient: Danielle Davenport   DOB: 02-20-1955   65 y.o. Female  MRN: [id number] Visit Date: 10/04/2020  Today's healthcare provider: Wilhemena Durie, MD   Chief Complaint  Patient presents with   Annual Exam   Subjective    Danielle Davenport Danielle Davenport is a 65 y.o. female who presents today for a complete physical exam.  She reports consuming a general diet. The patient does not participate in regular exercise at present. She generally feels well. She reports sleeping well. She does not have additional problems to discuss today.  Overall patient feels fairly well.  She had COVID 2 weeks ago.  She is retiring in 2023.  Past Medical History:  Diagnosis Date   Arthritis    BRCA negative    Colon polyp    Complication of anesthesia    Depression    Diverticulosis    Functional disorder of stomach    Gastroduodenal ulcer    GERD (gastroesophageal reflux disease)    Liver disorder    Thyroid disease    Hypothyroid   Past Surgical History:  Procedure Laterality Date   BREAST EXCISIONAL BIOPSY Left 2000   benign   BREAST SURGERY     COLONOSCOPY WITH PROPOFOL N/A 11/06/2019   Procedure: COLONOSCOPY WITH PROPOFOL;  Surgeon: Virgel Manifold, MD;  Location: [facility] ENDOSCOPY;  Service: Endoscopy;  Laterality: N/A;   HYSTEROSCOPY     INCONTINENCE SURGERY     TUBAL LIGATION     UTERINE FIBROID SURGERY     Social History   Socioeconomic History   Marital status: Married    Spouse name: Jori Moll   Number of children: 2   Years of education: HS   Highest education level: Not on file  Occupational History   Occupation:       WORK    Employer: [employer]  Tobacco Use   Smoking status: Never   Smokeless tobacco: Never  Vaping Use   Vaping Use: Never used  Substance and Sexual Activity   Alcohol use: No    Alcohol/week: 0.0 standard drinks   Drug use: No   Sexual activity: Yes    Birth control/protection: Post-menopausal  Other  Topics Concern   Not on file  Social History Narrative   Not on file   Social Determinants of Health   Financial Resource Strain: Not on file  Food Insecurity: Not on file  Transportation Needs: Not on file  Physical Activity: Not on file  Stress: Not on file  Social Connections: Not on file  Intimate Partner Violence: Not on file   Family Status  Relation Name Status   Mother  Deceased   Father  Deceased   Brother  Alive   Daughter  Alive   Son  Alive   MGF  (Not Specified)   PGF  (Not Specified)   Sister  (Not Specified)   Mat Aunt  (Not Specified)   Mat Uncle  (Not Specified)   Family History  Problem Relation Age of Onset   Breast cancer Mother 64   Brain cancer Mother    Cirrhosis Father    COPD Brother    COPD Daughter    Emphysema Maternal Grandfather    Stroke Paternal Grandfather    Brain cancer Sister    Breast cancer Maternal Aunt    Stroke Maternal Uncle    Allergies  Allergen Reactions   Sertraline Hcl     uncontrollable bowels, grinding  and clinching teeth.    Patient Care Team: Jerrol Banana., MD as PCP - General (Family Medicine)   Medications: Outpatient Medications Prior to Visit  Medication Sig   acetaminophen (TYLENOL) 325 MG tablet Take by mouth.   ALPRAZolam (XANAX) 0.25 MG tablet Take 1 tablet (0.25 mg total) by mouth at bedtime as needed for anxiety.   aspirin 81 MG chewable tablet Chew by mouth daily.   ibuprofen (ADVIL,MOTRIN) 800 MG tablet Take by mouth.   levothyroxine (SYNTHROID) 75 MCG tablet TAKE 1 TABLET BY MOUTH ONCE A DAY   Multiple Vitamins-Minerals (CENTRUM SILVER) CHEW Chew by mouth.   Multiple Vitamins-Minerals (ZINC PO) Take 1 tablet by mouth.   Omega-3 Fatty Acids (FISH OIL) 1000 MG CAPS Take by mouth.   omeprazole (PRILOSEC) 20 MG capsule Take 20 mg by mouth daily.   vitamin E 180 MG (400 UNITS) capsule Take 400 Units by mouth daily.   nitrofurantoin, macrocrystal-monohydrate, (MACROBID) 100 MG capsule Take  1 capsule (100 mg total) by mouth 2 (two) times daily. (Patient not taking: No sig reported)   ranitidine (ZANTAC) 150 MG tablet Take 1 tablet (150 mg total) by mouth 2 (two) times daily. (Patient not taking: Reported on 10/04/2020)   No facility-administered medications prior to visit.    Review of Systems  All other systems reviewed and are negative.     Objective    BP (!) 146/87   Pulse 76   Temp 98 F (36.7 C)   Resp 16   Ht 5' 3"  (1.6 m)   Wt 208 lb (94.3 kg)   BMI 36.85 kg/m  BP Readings from Last 3 Encounters:  10/04/20 (!) 146/87  01/07/20 138/80  12/24/19 138/82   Wt Readings from Last 3 Encounters:  10/04/20 208 lb (94.3 kg)  01/07/20 208 lb (94.3 kg)  12/24/19 209 lb (94.8 kg)      Physical Exam Vitals reviewed.  Constitutional:      Appearance: She is well-developed.  HENT:     Head: Normocephalic and atraumatic.     Right Ear: External ear normal.     Left Ear: External ear normal.     Nose: Nose normal.  Eyes:     Conjunctiva/sclera: Conjunctivae normal.  Cardiovascular:     Rate and Rhythm: Normal rate and regular rhythm.     Heart sounds: Normal heart sounds.     Comments: She has mildly tender varicose veins in her lower extremities. Chest:  Breasts:    Breasts are symmetrical.     Right: No inverted nipple, mass, skin change or tenderness.     Left: No inverted nipple, mass, nipple discharge, skin change or tenderness.  Abdominal:     General: Bowel sounds are normal.     Palpations: Abdomen is soft.  Musculoskeletal:        General: Normal range of motion.     Cervical back: Normal range of motion.  Skin:    General: Skin is warm and dry.     Capillary Refill: Capillary refill takes less than 2 seconds.  Neurological:     General: No focal deficit present.     Mental Status: She is alert and oriented to person, place, and time.  Psychiatric:        Mood and Affect: Mood normal.        Behavior: Behavior normal.        Thought  Content: Thought content normal.        Judgment:  Judgment normal.      Last depression screening scores PHQ 2/9 Scores 10/04/2020 10/01/2019 09/18/2017  PHQ - 2 Score 0 0 2  PHQ- 9 Score - 2 4   Last fall risk screening Fall Risk  10/04/2020  Falls in the past year? 0  Number falls in past yr: 0  Injury with Fall? 0  Risk for fall due to : No Fall Risks  Follow up Falls evaluation completed   Last Audit-C alcohol use screening Alcohol Use Disorder Test (AUDIT) 10/04/2020  1. How often do you have a drink containing alcohol? 0  2. How many drinks containing alcohol do you have on a typical day when you are drinking? 0  3. How often do you have six or more drinks on one occasion? 0  AUDIT-C Score 0  Alcohol Brief Interventions/Follow-up -   A score of 3 or more in women, and 4 or more in men indicates increased risk for alcohol abuse, EXCEPT if all of the points are from question 1   No results found for any visits on 10/04/20.  Assessment & Plan    Routine Health Maintenance and Physical Exam  Exercise Activities and Dietary recommendations  Goals   None     Immunization History  Administered Date(s) Administered   Moderna Sars-Covid-2 Vaccination 02/11/2019, 03/11/2019, 11/26/2019   Tdap 12/09/2013    Health Maintenance  Topic Date Due   HIV Screening  Never done   Zoster Vaccines- Shingrix (1 of 2) Never done   COVID-19 Vaccine (4 - Booster for Moderna series) 02/18/2020   DEXA SCAN  Never done   INFLUENZA VACCINE  08/09/2020   MAMMOGRAM  01/06/2022   PAP SMEAR-Modifier  12/24/2022   TETANUS/TDAP  12/10/2023   COLONOSCOPY (Pts 45-2yr Insurance coverage will need to be confirmed)  11/05/2029   Hepatitis C Screening  Completed   HPV VACCINES  Aged Out    Discussed health benefits of physical activity, and encouraged her to engage in regular exercise appropriate for her age and condition.  1. Annual physical exam Up-to-date on screening issues - CBC with  Differential/Platelet - Comprehensive metabolic panel - Lipid panel - TSH  2. Need for pneumococcal vaccination Patient now 65 - Pneumococcal conjugate vaccine 20-valent (Prevnar 20)  3. Class 2 obesity due to excess calories without serious comorbidity with body mass index (BMI) of 37.0 to 37.9 in adult   4. Lymphedema   5. Varicose veins of both lower extremities with pain Consider referring to vascular surgery  No follow-ups on file.     I, RWilhemena Durie MD, have reviewed all documentation for this visit. The documentation on 10/08/20 for the exam, diagnosis, procedures, and orders are all accurate and complete.    Richard GCranford Mon MD  [phone](phone) [phone](fax)  [place]

## 2020-10-05 LAB — CBC WITH DIFFERENTIAL/PLATELET
Basophils Absolute: 0.1 10*3/uL (ref 0.0–0.2)
Basos: 1 %
EOS (ABSOLUTE): 0.3 10*3/uL (ref 0.0–0.4)
Eos: 3 %
Hematocrit: 40.5 % (ref 34.0–46.6)
Hemoglobin: 13.6 g/dL (ref 11.1–15.9)
Immature Grans (Abs): 0 10*3/uL (ref 0.0–0.1)
Immature Granulocytes: 0 %
Lymphocytes Absolute: 3.4 10*3/uL — ABNORMAL HIGH (ref 0.7–3.1)
Lymphs: 36 %
MCH: 28.8 pg (ref 26.6–33.0)
MCHC: 33.6 g/dL (ref 31.5–35.7)
MCV: 86 fL (ref 79–97)
Monocytes Absolute: 0.8 10*3/uL (ref 0.1–0.9)
Monocytes: 8 %
Neutrophils Absolute: 5 10*3/uL (ref 1.4–7.0)
Neutrophils: 52 %
Platelets: 272 10*3/uL (ref 150–450)
RBC: 4.73 x10E6/uL (ref 3.77–5.28)
RDW: 13.2 % (ref 11.7–15.4)
WBC: 9.6 10*3/uL (ref 3.4–10.8)

## 2020-10-05 LAB — COMPREHENSIVE METABOLIC PANEL
ALT: 20 IU/L (ref 0–32)
AST: 18 IU/L (ref 0–40)
Albumin/Globulin Ratio: 2 (ref 1.2–2.2)
Albumin: 4.7 g/dL (ref 3.8–4.8)
Alkaline Phosphatase: 122 IU/L — ABNORMAL HIGH (ref 44–121)
BUN/Creatinine Ratio: 18 (ref 12–28)
BUN: 14 mg/dL (ref 8–27)
Bilirubin Total: 0.3 mg/dL (ref 0.0–1.2)
CO2: 20 mmol/L (ref 20–29)
Calcium: 9.6 mg/dL (ref 8.7–10.3)
Chloride: 103 mmol/L (ref 96–106)
Creatinine, Ser: 0.77 mg/dL (ref 0.57–1.00)
Globulin, Total: 2.4 g/dL (ref 1.5–4.5)
Glucose: 91 mg/dL (ref 70–99)
Potassium: 4.5 mmol/L (ref 3.5–5.2)
Sodium: 142 mmol/L (ref 134–144)
Total Protein: 7.1 g/dL (ref 6.0–8.5)
eGFR: 86 mL/min/{1.73_m2} (ref >59)

## 2020-10-05 LAB — LIPID PANEL
Chol/HDL Ratio: 3.9 ratio (ref 0.0–4.4)
Cholesterol, Total: 209 mg/dL — ABNORMAL HIGH (ref 100–199)
HDL: 54 mg/dL (ref >39)
LDL Chol Calc (NIH): 133 mg/dL — ABNORMAL HIGH (ref 0–99)
Triglycerides: 125 mg/dL (ref 0–149)
VLDL Cholesterol Cal: 22 mg/dL (ref 5–40)

## 2020-10-05 LAB — TSH: TSH: 1.57 u[IU]/mL (ref 0.450–4.500)

## 2020-11-23 ENCOUNTER — Other Ambulatory Visit: Payer: Self-pay | Admitting: Family Medicine

## 2020-11-23 DIAGNOSIS — Z1231 Encounter for screening mammogram for malignant neoplasm of breast: Secondary | ICD-10-CM

## 2021-01-07 ENCOUNTER — Other Ambulatory Visit: Payer: Self-pay

## 2021-01-07 ENCOUNTER — Ambulatory Visit
Admission: RE | Admit: 2021-01-07 | Discharge: 2021-01-07 | Disposition: A | Discharge status: Home / Self Care | Payer: Commercial Managed Care - PPO | Source: Ambulatory Visit | Attending: Family Medicine | Admitting: Family Medicine

## 2021-01-07 DIAGNOSIS — Z1231 Encounter for screening mammogram for malignant neoplasm of breast: Secondary | ICD-10-CM | POA: Diagnosis present

## 2021-01-11 ENCOUNTER — Other Ambulatory Visit: Payer: Self-pay | Admitting: Family Medicine

## 2021-01-11 ENCOUNTER — Other Ambulatory Visit: Payer: Self-pay | Admitting: Physician Assistant

## 2021-01-11 DIAGNOSIS — R921 Mammographic calcification found on diagnostic imaging of breast: Secondary | ICD-10-CM

## 2021-01-11 DIAGNOSIS — R928 Other abnormal and inconclusive findings on diagnostic imaging of breast: Secondary | ICD-10-CM

## 2021-01-18 ENCOUNTER — Ambulatory Visit
Admission: RE | Admit: 2021-01-18 | Discharge: 2021-01-18 | Disposition: A | Discharge status: Home / Self Care | Payer: Commercial Managed Care - PPO | Source: Ambulatory Visit | Attending: Physician Assistant | Admitting: Physician Assistant

## 2021-01-18 ENCOUNTER — Other Ambulatory Visit: Payer: Self-pay | Admitting: Physician Assistant

## 2021-01-18 DIAGNOSIS — R921 Mammographic calcification found on diagnostic imaging of breast: Secondary | ICD-10-CM

## 2021-01-18 DIAGNOSIS — R928 Other abnormal and inconclusive findings on diagnostic imaging of breast: Secondary | ICD-10-CM | POA: Insufficient documentation

## 2021-01-19 LAB — HM DIABETES EYE EXAM

## 2021-01-26 ENCOUNTER — Ambulatory Visit
Admission: RE | Admit: 2021-01-26 | Discharge: 2021-01-26 | Disposition: A | Discharge status: Home / Self Care | Payer: Commercial Managed Care - PPO | Source: Ambulatory Visit | Attending: Physician Assistant | Admitting: Physician Assistant

## 2021-01-26 ENCOUNTER — Other Ambulatory Visit: Payer: Self-pay

## 2021-01-26 DIAGNOSIS — R921 Mammographic calcification found on diagnostic imaging of breast: Secondary | ICD-10-CM | POA: Diagnosis not present

## 2021-01-26 DIAGNOSIS — R928 Other abnormal and inconclusive findings on diagnostic imaging of breast: Secondary | ICD-10-CM

## 2021-01-26 HISTORY — PX: BREAST BIOPSY: SHX20

## 2021-01-27 LAB — SURGICAL PATHOLOGY

## 2021-02-11 ENCOUNTER — Encounter: Payer: Self-pay | Admitting: *Deleted

## 2021-03-02 ENCOUNTER — Encounter: Payer: Self-pay | Admitting: Ophthalmology

## 2021-03-03 NOTE — Discharge Instructions (Signed)

## 2021-03-08 ENCOUNTER — Other Ambulatory Visit: Payer: Self-pay

## 2021-03-08 ENCOUNTER — Encounter
Admission: RE | Disposition: A | Discharge status: Home / Self Care | Payer: Self-pay | Source: Ambulatory Visit | Attending: Ophthalmology

## 2021-03-08 ENCOUNTER — Ambulatory Visit: Payer: Commercial Managed Care - PPO | Admitting: Anesthesiology

## 2021-03-08 ENCOUNTER — Ambulatory Visit
Admission: RE | Admit: 2021-03-08 | Discharge: 2021-03-08 | Disposition: A | Discharge status: Home / Self Care | Payer: Commercial Managed Care - PPO | Source: Ambulatory Visit | Attending: Ophthalmology | Admitting: Ophthalmology

## 2021-03-08 ENCOUNTER — Encounter: Payer: Self-pay | Admitting: Ophthalmology

## 2021-03-08 DIAGNOSIS — F32A Depression, unspecified: Secondary | ICD-10-CM | POA: Diagnosis not present

## 2021-03-08 DIAGNOSIS — E039 Hypothyroidism, unspecified: Secondary | ICD-10-CM | POA: Diagnosis not present

## 2021-03-08 DIAGNOSIS — I739 Peripheral vascular disease, unspecified: Secondary | ICD-10-CM | POA: Insufficient documentation

## 2021-03-08 DIAGNOSIS — H2512 Age-related nuclear cataract, left eye: Secondary | ICD-10-CM | POA: Diagnosis not present

## 2021-03-08 HISTORY — PX: CATARACT EXTRACTION W/PHACO: SHX586

## 2021-03-08 HISTORY — DX: Fatty (change of) liver, not elsewhere classified: K76.0

## 2021-03-08 SURGERY — PHACOEMULSIFICATION, CATARACT, WITH IOL INSERTION
Anesthesia: Monitor Anesthesia Care | Site: Eye | Laterality: Left

## 2021-03-08 MED ORDER — FENTANYL CITRATE (PF) 100 MCG/2ML IJ SOLN
INTRAMUSCULAR | Status: DC | PRN
  Administered 2021-03-08 (×2): 50 ug via INTRAVENOUS

## 2021-03-08 MED ORDER — SIGHTPATH DOSE#1 NA CHONDROIT SULF-NA HYALURON 40-17 MG/ML IO SOLN
INTRAOCULAR | Status: DC | PRN
  Administered 2021-03-08: 1 mL via INTRAOCULAR

## 2021-03-08 MED ORDER — MIDAZOLAM HCL 2 MG/2ML IJ SOLN
INTRAMUSCULAR | Status: DC | PRN
  Administered 2021-03-08 (×2): 1 mg via INTRAVENOUS

## 2021-03-08 MED ORDER — BRIMONIDINE TARTRATE-TIMOLOL 0.2-0.5 % OP SOLN
OPHTHALMIC | Status: DC | PRN
  Administered 2021-03-08: 1 [drp] via OPHTHALMIC

## 2021-03-08 MED ORDER — LACTATED RINGERS IV SOLN: INTRAVENOUS | Status: DC

## 2021-03-08 MED ORDER — MOXIFLOXACIN HCL 0.5 % OP SOLN
OPHTHALMIC | Status: DC | PRN
Start: 2021-03-08 — End: 2021-03-08
  Administered 2021-03-08: 0.2 mL via OPHTHALMIC

## 2021-03-08 MED ORDER — TETRACAINE HCL 0.5 % OP SOLN
1.0000 [drp] | OPHTHALMIC | Status: DC | PRN
Start: 2021-03-08 — End: 2021-03-08
  Administered 2021-03-08 (×3): 1 [drp] via OPHTHALMIC

## 2021-03-08 MED ORDER — SIGHTPATH DOSE#1 BSS IO SOLN
INTRAOCULAR | Status: DC | PRN
  Administered 2021-03-08: 45 mL via OPHTHALMIC

## 2021-03-08 MED ORDER — SIGHTPATH DOSE#1 BSS IO SOLN
INTRAOCULAR | Status: DC | PRN
  Administered 2021-03-08: 1 mL via INTRAMUSCULAR

## 2021-03-08 MED ORDER — ARMC OPHTHALMIC DILATING DROPS
1.0000 "application " | OPHTHALMIC | Status: DC | PRN
  Administered 2021-03-08 (×3): 1 via OPHTHALMIC

## 2021-03-08 MED ORDER — SIGHTPATH DOSE#1 BSS IO SOLN
INTRAOCULAR | Status: DC | PRN
  Administered 2021-03-08: 15 mL

## 2021-03-08 SURGICAL SUPPLY — 10 items
CATARACT SUITE SIGHTPATH (MISCELLANEOUS) ×2 IMPLANT
FEE CATARACT SUITE SIGHTPATH (MISCELLANEOUS) ×1 IMPLANT
GLOVE SURG ENC TEXT LTX SZ8 (GLOVE) ×2 IMPLANT
GLOVE SURG TRIUMPH 8.0 PF LTX (GLOVE) ×2 IMPLANT
LENS IOL TECNIS EYHANCE 18.0 (Intraocular Lens) ×1 IMPLANT
NDL FILTER BLUNT 18X1 1/2 (NEEDLE) ×1 IMPLANT
NEEDLE FILTER BLUNT 18X 1/2SAF (NEEDLE) ×1
NEEDLE FILTER BLUNT 18X1 1/2 (NEEDLE) ×1 IMPLANT
SYR 3ML LL SCALE MARK (SYRINGE) ×2 IMPLANT
WATER STERILE IRR 250ML POUR (IV SOLUTION) ×2 IMPLANT

## 2021-03-08 NOTE — Anesthesia Preprocedure Evaluation (Signed)
Anesthesia Evaluation  Patient identified by MRN, date of birth, ID band Patient awake    Reviewed: Allergy & Precautions, H&P , NPO status , Patient's Chart, lab work & pertinent test results, reviewed documented beta blocker date and time   Airway Mallampati: II  TM Distance: >3 FB Neck ROM: full    Dental no notable dental hx.    Pulmonary neg pulmonary ROS,    Pulmonary exam normal breath sounds clear to auscultation       Cardiovascular Exercise Tolerance: Good + Peripheral Vascular Disease  Normal cardiovascular exam Rhythm:regular Rate:Normal     Neuro/Psych Depression negative neurological ROS     GI/Hepatic Neg liver ROS, GERD  Controlled,  Endo/Other  Hypothyroidism   Renal/GU negative Renal ROS  negative genitourinary   Musculoskeletal   Abdominal   Peds  Hematology negative hematology ROS (+)   Anesthesia Other Findings   Reproductive/Obstetrics negative OB ROS                             Anesthesia Physical Anesthesia Plan  ASA: 2  Anesthesia Plan: MAC   Post-op Pain Management:    Induction:   PONV Risk Score and Plan:   Airway Management Planned:   Additional Equipment:   Intra-op Plan:   Post-operative Plan:   Informed Consent: I have reviewed the patients History and Physical, chart, labs and discussed the procedure including the risks, benefits and alternatives for the proposed anesthesia with the patient or authorized representative who has indicated his/her understanding and acceptance.     Dental Advisory Given  Plan Discussed with: CRNA and Anesthesiologist  Anesthesia Plan Comments:         Anesthesia Quick Evaluation

## 2021-03-08 NOTE — Op Note (Signed)
PREOPERATIVE DIAGNOSIS:  Nuclear sclerotic cataract of the left eye.   POSTOPERATIVE DIAGNOSIS:  Nuclear sclerotic cataract of the left eye.   OPERATIVE PROCEDURE:ORPROCALL@   SURGEON:  Birder Robson, MD.   ANESTHESIA:  Anesthesiologist: Rochel Brome, MD CRNA: Garner Nash, CRNA  1.      Managed anesthesia care. 2.     0.48ml of Shugarcaine was instilled following the paracentesis   COMPLICATIONS:  None.   TECHNIQUE:   Stop and chop   DESCRIPTION OF PROCEDURE:  The patient was examined and consented in the preoperative holding area where the aforementioned topical anesthesia was applied to the left eye and then brought back to the Operating Room where the left eye was prepped and draped in the usual sterile ophthalmic fashion and a lid speculum was placed. A paracentesis was created with the side port blade and the anterior chamber was filled with viscoelastic. A near clear corneal incision was performed with the steel keratome. A continuous curvilinear capsulorrhexis was performed with a cystotome followed by the capsulorrhexis forceps. Hydrodissection and hydrodelineation were carried out with BSS on a blunt cannula. The lens was removed in a stop and chop  technique and the remaining cortical material was removed with the irrigation-aspiration handpiece. The capsular bag was inflated with viscoelastic and the Technis [id number] lens was placed in the capsular bag without complication. The remaining viscoelastic was removed from the eye with the irrigation-aspiration handpiece. The wounds were hydrated. The anterior chamber was flushed with BSS and the eye was inflated to physiologic pressure. 0.25ml Vigamox was placed in the anterior chamber. The wounds were found to be water tight. The eye was dressed with Combigan. The patient was given protective glasses to wear throughout the day and a shield with which to sleep tonight. The patient was also given drops with which to begin a drop regimen  today and will follow-up with me in one day. Implant Name Type Inv. Item Serial No. Manufacturer Lot No. LRB No. Used Action  LENS IOL TECNIS EYHANCE 18.0 - [id number] Intraocular Lens LENS IOL TECNIS EYHANCE 18.0 [id number] [employer]  Left 1 Implanted    Procedure(s): CATARACT EXTRACTION PHACO AND INTRAOCULAR LENS PLACEMENT (IOC) LEFT 3.23 00:20.3 (Left)  Electronically signed: Birder Robson 03/08/2021 11:56 AM

## 2021-03-08 NOTE — Transfer of Care (Signed)
Immediate Anesthesia Transfer of Care Note  Patient: Danielle Davenport  Procedure(s) Performed: CATARACT EXTRACTION PHACO AND INTRAOCULAR LENS PLACEMENT (IOC) LEFT 3.23 00:20.3 (Left: Eye)  Patient Location: PACU  Anesthesia Type: MAC  Level of Consciousness: awake, alert  and patient cooperative  Airway and Oxygen Therapy: Patient Spontanous Breathing and Patient connected to supplemental oxygen  Post-op Assessment: Post-op Vital signs reviewed, Patient's Cardiovascular Status Stable, Respiratory Function Stable, Patent Airway and No signs of Nausea or vomiting  Post-op Vital Signs: Reviewed and stable  Complications: No notable events documented.

## 2021-03-08 NOTE — Anesthesia Postprocedure Evaluation (Signed)
Anesthesia Post Note  Patient: Danielle Davenport  Procedure(s) Performed: CATARACT EXTRACTION PHACO AND INTRAOCULAR LENS PLACEMENT (IOC) LEFT 3.23 00:20.3 (Left: Eye)     Patient location during evaluation: PACU Anesthesia Type: MAC Level of consciousness: awake and alert Pain management: pain level controlled Vital Signs Assessment: post-procedure vital signs reviewed and stable Respiratory status: spontaneous breathing, nonlabored ventilation, respiratory function stable and patient connected to nasal cannula oxygen Cardiovascular status: stable and blood pressure returned to baseline Postop Assessment: no apparent nausea or vomiting Anesthetic complications: no   No notable events documented.  Trecia Rogers

## 2021-03-08 NOTE — H&P (Signed)
[facility]   Primary Care Physician:  Jerrol Banana., MD Ophthalmologist: Dr. Hortense Ramal  Pre-Procedure History & Physical: HPI:  Danielle Davenport is a 66 y.o. female here for cataract surgery.   Past Medical History:  Diagnosis Date   Arthritis    BRCA negative    Colon polyp    Complication of anesthesia    slow to wake   Depression    Diverticulosis    Fatty liver    Functional disorder of stomach    Gastroduodenal ulcer    GERD (gastroesophageal reflux disease)    Liver disorder    Thyroid disease    Hypothyroid    Past Surgical History:  Procedure Laterality Date   BREAST BIOPSY Left 01/26/2021   stereo bx-calcs/?COIL" clip-path pending   BREAST EXCISIONAL BIOPSY Left 2000   benign   BREAST SURGERY     COLONOSCOPY WITH PROPOFOL N/A 11/06/2019   Procedure: COLONOSCOPY WITH PROPOFOL;  Surgeon: Virgel Manifold, MD;  Location: [facility] ENDOS[facility];  Service: Endoscopy;  Laterality: N/A;   HYSTEROSCOPY     INCONTINENCE SURGERY     TUBAL LIGATION     UTERINE FIBROID SURGERY      Prior to Admission medications   Medication Sig Start Date End Date Taking? Authorizing Provider  acetaminophen (TYLENOL) 325 MG tablet Take by mouth.   Yes [provider]  aspirin 81 MG chewable tablet Chew by mouth daily.   Yes [provider]  ibuprofen (ADVIL,MOTRIN) 800 MG tablet Take by mouth.   Yes [provider]  levothyroxine (SYNTHROID) 75 MCG tablet TAKE 1 TABLET BY MOUTH ONCE A DAY 09/21/20  Yes Jerrol Banana., MD  Multiple Vitamins-Minerals (CENTRUM SILVER) CHEW Chew by mouth.   Yes [provider]  Multiple Vitamins-Minerals (ZINC PO) Take 1 tablet by mouth.   Yes [provider]  Omega-3 Fatty Acids (FISH OIL) 1000 MG CAPS Take by mouth.   Yes [provider]  vitamin E 180 MG (400 UNITS) capsule Take 400 Units by mouth daily.   Yes [provider]  ALPRAZolam (XANAX) 0.25 MG tablet Take  1 tablet (0.25 mg total) by mouth at bedtime as needed for anxiety. Patient not taking: Reported on 03/02/2021 10/01/19   Jerrol Banana., MD    Allergies as of 02/01/2021 - Review Complete 01/26/2021  Allergen Reaction Noted   Sertraline hcl  08/29/2016    Family History  Problem Relation Age of Onset   Breast cancer Mother 20   Brain cancer Mother    Cirrhosis Father    COPD Brother    COPD Daughter    Emphysema Maternal Grandfather    Stroke Paternal Grandfather    Brain cancer Sister    Breast cancer Maternal Aunt    Stroke Maternal Uncle     Social History   Socioeconomic History   Marital status: Married    Spouse name: Jori Moll   Number of children: 2   Years of education: HS   Highest education level: Not on file  Occupational History   Occupation:       WORK    Employer: [employer]  Tobacco Use   Smoking status: Never   Smokeless tobacco: Never  Vaping Use   Vaping Use: Never used  Substance and Sexual Activity   Alcohol use: No    Alcohol/week: 0.0 standard drinks   Drug use: No   Sexual activity: Yes    Birth control/protection: Post-menopausal  Other Topics Concern   Not on file  Social History Narrative   Not on file   Social Determinants of Health   Financial Resource Strain: Not on file  Food Insecurity: Not on file  Transportation Needs: Not on file  Physical Activity: Not on file  Stress: Not on file  Social Connections: Not on file  Intimate Partner Violence: Not on file    Review of Systems: See HPI, otherwise negative ROS  Physical Exam: BP (!) 175/88    Pulse 70    Temp (!) 97.3 F (36.3 C) (Temporal)    Resp 16    Ht 5' 3"  (1.6 m)    Wt 93.9 kg    SpO2 100%    BMI 36.67 kg/m  General:   Alert, cooperative in NAD Head:  Normocephalic and atraumatic. Respiratory:  Normal work of breathing. Cardiovascular:  RRR  Impression/Plan: Danielle Davenport is here for cataract surgery.  Risks, benefits, limitations,  and alternatives regarding cataract surgery have been reviewed with the patient.  Questions have been answered.  All parties agreeable.   Birder Robson, MD  03/08/2021, 11:33 AM

## 2021-03-10 ENCOUNTER — Encounter: Payer: Self-pay | Admitting: Ophthalmology

## 2021-03-22 ENCOUNTER — Ambulatory Visit: Payer: Commercial Managed Care - PPO | Admitting: Anesthesiology

## 2021-03-22 ENCOUNTER — Encounter
Admission: RE | Disposition: A | Discharge status: Home / Self Care | Payer: Self-pay | Source: Ambulatory Visit | Attending: Ophthalmology

## 2021-03-22 ENCOUNTER — Other Ambulatory Visit: Payer: Self-pay

## 2021-03-22 ENCOUNTER — Ambulatory Visit
Admission: RE | Admit: 2021-03-22 | Discharge: 2021-03-22 | Disposition: A | Discharge status: Home / Self Care | Payer: Commercial Managed Care - PPO | Source: Ambulatory Visit | Attending: Ophthalmology | Admitting: Ophthalmology

## 2021-03-22 ENCOUNTER — Encounter: Payer: Self-pay | Admitting: Ophthalmology

## 2021-03-22 DIAGNOSIS — E039 Hypothyroidism, unspecified: Secondary | ICD-10-CM | POA: Diagnosis not present

## 2021-03-22 DIAGNOSIS — K219 Gastro-esophageal reflux disease without esophagitis: Secondary | ICD-10-CM | POA: Insufficient documentation

## 2021-03-22 DIAGNOSIS — H2511 Age-related nuclear cataract, right eye: Secondary | ICD-10-CM | POA: Diagnosis present

## 2021-03-22 SURGERY — PHACOEMULSIFICATION, CATARACT, WITH IOL INSERTION
Anesthesia: Monitor Anesthesia Care | Site: Eye | Laterality: Right

## 2021-03-22 MED ORDER — FENTANYL CITRATE (PF) 100 MCG/2ML IJ SOLN
INTRAMUSCULAR | Status: DC | PRN
Start: 2021-03-22 — End: 2021-03-22
  Administered 2021-03-22: 50 ug via INTRAVENOUS

## 2021-03-22 MED ORDER — LACTATED RINGERS IV SOLN: INTRAVENOUS | Status: DC

## 2021-03-22 MED ORDER — ACETAMINOPHEN 160 MG/5ML PO SOLN: 325.0000 mg | Freq: Once | ORAL | Status: DC

## 2021-03-22 MED ORDER — MOXIFLOXACIN HCL 0.5 % OP SOLN
OPHTHALMIC | Status: DC | PRN
  Administered 2021-03-22: 0.2 mL via OPHTHALMIC

## 2021-03-22 MED ORDER — MIDAZOLAM HCL 2 MG/2ML IJ SOLN
INTRAMUSCULAR | Status: DC | PRN
Start: 2021-03-22 — End: 2021-03-22
  Administered 2021-03-22: 1 mg via INTRAVENOUS

## 2021-03-22 MED ORDER — SIGHTPATH DOSE#1 BSS IO SOLN
INTRAOCULAR | Status: DC | PRN
  Administered 2021-03-22: 1 mL

## 2021-03-22 MED ORDER — TETRACAINE HCL 0.5 % OP SOLN
1.0000 [drp] | OPHTHALMIC | Status: DC | PRN
  Administered 2021-03-22 (×3): 1 [drp] via OPHTHALMIC

## 2021-03-22 MED ORDER — BRIMONIDINE TARTRATE-TIMOLOL 0.2-0.5 % OP SOLN
OPHTHALMIC | Status: DC | PRN
  Administered 2021-03-22: 1 [drp] via OPHTHALMIC

## 2021-03-22 MED ORDER — ARMC OPHTHALMIC DILATING DROPS
1.0000 "application " | OPHTHALMIC | Status: DC | PRN
  Administered 2021-03-22 (×3): 1 via OPHTHALMIC

## 2021-03-22 MED ORDER — SIGHTPATH DOSE#1 BSS IO SOLN
INTRAOCULAR | Status: DC | PRN
  Administered 2021-03-22: 15 mL

## 2021-03-22 MED ORDER — SIGHTPATH DOSE#1 BSS IO SOLN
INTRAOCULAR | Status: DC | PRN
  Administered 2021-03-22: 40 mL via OPHTHALMIC

## 2021-03-22 MED ORDER — ACETAMINOPHEN 325 MG PO TABS: 325.0000 mg | ORAL_TABLET | Freq: Once | ORAL | Status: DC

## 2021-03-22 MED ORDER — SIGHTPATH DOSE#1 NA CHONDROIT SULF-NA HYALURON 40-17 MG/ML IO SOLN
INTRAOCULAR | Status: DC | PRN
  Administered 2021-03-22: 1 mL via INTRAOCULAR

## 2021-03-22 SURGICAL SUPPLY — 16 items
CANNULA ANT/CHMB 27G (MISCELLANEOUS) IMPLANT
CANNULA ANT/CHMB 27GA (MISCELLANEOUS) IMPLANT
CATARACT SUITE SIGHTPATH (MISCELLANEOUS) ×2 IMPLANT
FEE CATARACT SUITE SIGHTPATH (MISCELLANEOUS) ×1 IMPLANT
GLOVE SURG ENC TEXT LTX SZ8 (GLOVE) ×2 IMPLANT
GLOVE SURG TRIUMPH 8.0 PF LTX (GLOVE) ×2 IMPLANT
LENS IOL TECNIS EYHANCE 19.0 (Intraocular Lens) ×1 IMPLANT
NDL FILTER BLUNT 18X1 1/2 (NEEDLE) ×1 IMPLANT
NEEDLE FILTER BLUNT 18X 1/2SAF (NEEDLE) ×1
NEEDLE FILTER BLUNT 18X1 1/2 (NEEDLE) ×1 IMPLANT
PACK VIT ANT 23G (MISCELLANEOUS) IMPLANT
RING MALYGIN (MISCELLANEOUS) IMPLANT
SUT ETHILON 10-0 CS-B-6CS-B-6 (SUTURE)
SUTURE EHLN 10-0 CS-B-6CS-B-6 (SUTURE) IMPLANT
SYR 3ML LL SCALE MARK (SYRINGE) ×2 IMPLANT
WATER STERILE IRR 250ML POUR (IV SOLUTION) ×2 IMPLANT

## 2021-03-22 NOTE — Anesthesia Postprocedure Evaluation (Signed)
Anesthesia Post Note ? ?Patient: Danielle Davenport ? ?Procedure(s) Performed: CATARACT EXTRACTION PHACO AND INTRAOCULAR LENS PLACEMENT (IOC) RIGHT (Right: Eye) ? ? ?  ?Patient location during evaluation: PACU ?Anesthesia Type: MAC ?Level of consciousness: awake and alert and oriented ?Pain management: satisfactory to patient ?Vital Signs Assessment: post-procedure vital signs reviewed and stable ?Respiratory status: spontaneous breathing, nonlabored ventilation and respiratory function stable ?Cardiovascular status: blood pressure returned to baseline and stable ?Postop Assessment: Adequate PO intake and No signs of nausea or vomiting ?Anesthetic complications: no ? ? ?No notable events documented. ? ?Raliegh Ip ? ? ? ? ? ?

## 2021-03-22 NOTE — Transfer of Care (Signed)
Immediate Anesthesia Transfer of Care Note ? ?Patient: Danielle Davenport ? ?Procedure(s) Performed: CATARACT EXTRACTION PHACO AND INTRAOCULAR LENS PLACEMENT (IOC) RIGHT (Right: Eye) ? ?Patient Location: PACU ? ?Anesthesia Type: MAC ? ?Level of Consciousness: awake, alert  and patient cooperative ? ?Airway and Oxygen Therapy: Patient Spontanous Breathing and Patient connected to supplemental oxygen ? ?Post-op Assessment: Post-op Vital signs reviewed, Patient's Cardiovascular Status Stable, Respiratory Function Stable, Patent Airway and No signs of Nausea or vomiting ? ?Post-op Vital Signs: Reviewed and stable ? ?Complications: No notable events documented. ? ?

## 2021-03-22 NOTE — Anesthesia Preprocedure Evaluation (Signed)
Anesthesia Evaluation  ?Patient identified by MRN, date of birth, ID band ?Patient awake ? ? ? ?Reviewed: ?Allergy & Precautions, H&P , NPO status , Patient's Chart, lab work & pertinent test results, reviewed documented beta blocker date and time  ? ?Airway ?Mallampati: II ? ?TM Distance: >3 FB ?Neck ROM: full ? ? ? Dental ?no notable dental hx. ? ?  ?Pulmonary ?neg pulmonary ROS,  ?  ?Pulmonary exam normal ?breath sounds clear to auscultation ? ? ? ? ? ? Cardiovascular ?Exercise Tolerance: Good ?+ Peripheral Vascular Disease  ?Normal cardiovascular exam ?Rhythm:regular Rate:Normal ? ? ?  ?Neuro/Psych ?Depression negative neurological ROS ?   ? GI/Hepatic ?Neg liver ROS, GERD  Controlled,  ?Endo/Other  ?Hypothyroidism overweight ? Renal/GU ?negative Renal ROS  ?negative genitourinary ?  ?Musculoskeletal ? ? Abdominal ?  ?Peds ? Hematology ?negative hematology ROS ?(+)   ?Anesthesia Other Findings ? ? Reproductive/Obstetrics ?negative OB ROS ? ?  ? ? ? ? ? ? ? ? ? ? ? ? ? ?  ?  ? ? ? ? ? ? ? ? ?Anesthesia Physical ? ?Anesthesia Plan ? ?ASA: 2 ? ?Anesthesia Plan: MAC  ? ?Post-op Pain Management: Minimal or no pain anticipated  ? ?Induction:  ? ?PONV Risk Score and Plan: 2 and Treatment may vary due to age or medical condition, TIVA and Midazolam ? ?Airway Management Planned:  ? ?Additional Equipment:  ? ?Intra-op Plan:  ? ?Post-operative Plan:  ? ?Informed Consent: I have reviewed the patients History and Physical, chart, labs and discussed the procedure including the risks, benefits and alternatives for the proposed anesthesia with the patient or authorized representative who has indicated his/her understanding and acceptance.  ? ? ? ?Dental Advisory Given ? ?Plan Discussed with: CRNA and Anesthesiologist ? ?Anesthesia Plan Comments:   ? ? ? ? ? ? ?Anesthesia Quick Evaluation ? ?

## 2021-03-22 NOTE — H&P (Signed)
[place]  ? ?Primary Care Physician:  Jerrol Banana., MD ?Ophthalmologist: Dr. George Ina ? ?Pre-Procedure History & Physical: ?HPI:  Danielle Davenport is a 66 y.o. female here for cataract surgery. ?  ?Past Medical History:  ?Diagnosis Date  ? Arthritis   ? BRCA negative   ? Colon polyp   ? Complication of anesthesia   ? slow to wake  ? Depression   ? Diverticulosis   ? Fatty liver   ? Functional disorder of stomach   ? Gastroduodenal ulcer   ? GERD (gastroesophageal reflux disease)   ? Liver disorder   ? Thyroid disease   ? Hypothyroid  ? ? ?Past Surgical History:  ?Procedure Laterality Date  ? BREAST BIOPSY Left 01/26/2021  ? stereo bx-calcs/?COIL" clip-path pending  ? BREAST EXCISIONAL BIOPSY Left 2000  ? benign  ? BREAST SURGERY    ? CATARACT EXTRACTION W/PHACO Left 03/08/2021  ? Procedure: CATARACT EXTRACTION PHACO AND INTRAOCULAR LENS PLACEMENT (IOC) LEFT 3.23 00:20.3;  Surgeon: Birder Robson, MD;  Location: [place];  Service: Ophthalmology;  Laterality: Left;  ? COLONOSCOPY WITH PROPOFOL N/A 11/06/2019  ? Procedure: COLONOSCOPY WITH PROPOFOL;  Surgeon: Virgel Manifold, MD;  Location: [facility];  Service: Endoscopy;  Laterality: N/A;  ? HYSTEROSCOPY    ? INCONTINENCE SURGERY    ? TUBAL LIGATION    ? UTERINE FIBROID SURGERY    ? ? ?Prior to Admission medications   ?Medication Sig Start Date End Date Taking? Authorizing Provider  ?acetaminophen (TYLENOL) 325 MG tablet Take by mouth.   Yes [provider]  ?ALPRAZolam (XANAX) 0.25 MG tablet Take 1 tablet (0.25 mg total) by mouth at bedtime as needed for anxiety. 10/01/19  Yes Jerrol Banana., MD  ?aspirin 81 MG chewable tablet Chew by mouth daily.   Yes [provider]  ?ibuprofen (ADVIL,MOTRIN) 800 MG tablet Take by mouth.   Yes [provider]  ?levothyroxine (SYNTHROID) 75 MCG tablet TAKE 1 TABLET BY MOUTH ONCE A DAY 09/21/20  Yes Jerrol Banana., MD  ?Multiple  Vitamins-Minerals (CENTRUM SILVER) CHEW Chew by mouth.   Yes [provider]  ?Multiple Vitamins-Minerals (ZINC PO) Take 1 tablet by mouth.   Yes [provider]  ?Omega-3 Fatty Acids (FISH OIL) 1000 MG CAPS Take by mouth.   Yes [provider]  ?vitamin E 180 MG (400 UNITS) capsule Take 400 Units by mouth daily.   Yes [provider]  ? ? ?Allergies as of 02/01/2021 - Review Complete 01/26/2021  ?Allergen Reaction Noted  ? Sertraline hcl  08/29/2016  ? ? ?Family History  ?Problem Relation Age of Onset  ? Breast cancer Mother 60  ? Brain cancer Mother   ? Cirrhosis Father   ? COPD Brother   ? COPD Daughter   ? Emphysema Maternal Grandfather   ? Stroke Paternal Grandfather   ? Brain cancer Sister   ? Breast cancer Maternal Aunt   ? Stroke Maternal Uncle   ? ? ?Social History  ? ?Socioeconomic History  ? Marital status: Married  ?  Spouse name: Jori Moll  ? Number of children: 2  ? Years of education: HS  ? Highest education level: Not on file  ?Occupational History  ? Occupation:       WORK  ?  Employer: [employer]  ?Tobacco Use  ? Smoking status: Never  ? Smokeless tobacco: Never  ?Vaping Use  ? Vaping Use: Never used  ?Substance  and Sexual Activity  ? Alcohol use: No  ?  Alcohol/week: 0.0 standard drinks  ? Drug use: No  ? Sexual activity: Yes  ?  Birth control/protection: Post-menopausal  ?Other Topics Concern  ? Not on file  ?Social History Narrative  ? Not on file  ? ?Social Determinants of Health  ? ?Financial Resource Strain: Not on file  ?Food Insecurity: Not on file  ?Transportation Needs: Not on file  ?Physical Activity: Not on file  ?Stress: Not on file  ?Social Connections: Not on file  ?Intimate Partner Violence: Not on file  ? ? ?Review of Systems: ?See HPI, otherwise negative ROS ? ?Physical Exam: ?BP (!) 189/90   Pulse 70   Temp (!) 97.3 ?F (36.3 ?C) (Temporal)   Resp 20   Wt 94.3 kg   SpO2 100%   BMI 36.85 kg/m?  ?General:   Alert, cooperative in  NAD ?Head:  Normocephalic and atraumatic. ?Respiratory:  Normal work of breathing. ?Cardiovascular:  RRR ? ?Impression/Plan: ?Danielle Davenport is here for cataract surgery. ? ?Risks, benefits, limitations, and alternatives regarding cataract surgery have been reviewed with the patient.  Questions have been answered.  All parties agreeable. ? ? ?Birder Robson, MD  03/22/2021, 9:34 AM ? ? ?

## 2021-03-22 NOTE — Discharge Instructions (Signed)

## 2021-03-22 NOTE — Anesthesia Procedure Notes (Signed)
Procedure Name: [place] ?Date/Time: 03/22/2021 9:42 AM ?Performed by: Dionne Bucy, CRNA ?Pre-anesthesia Checklist: Patient identified, Emergency Drugs available, Suction available, Patient being monitored and Timeout performed ?Patient Re-evaluated:Patient Re-evaluated prior to induction ?Oxygen Delivery Method: Nasal cannula ?Placement Confirmation: positive ETCO2 ? ? ? ? ?

## 2021-03-22 NOTE — Op Note (Signed)
PREOPERATIVE DIAGNOSIS:  Nuclear sclerotic cataract of the right eye. ?  ?POSTOPERATIVE DIAGNOSIS:  Cataract ?  ?OPERATIVE PROCEDURE:ORPROCALL@ ?  ?SURGEON:  Birder Robson, MD. ?  ?ANESTHESIA: ? ?Anesthesiologist: Ronelle Nigh, MD ?CRNA: Dionne Bucy, CRNA ? ?1.      Managed anesthesia care. ?2.      0.53m of Shugarcaine was instilled in the eye following the paracentesis. ?  ?COMPLICATIONS:  None. ?  ?TECHNIQUE:   Stop and chop ?  ?DESCRIPTION OF PROCEDURE:  The patient was examined and consented in the preoperative holding area where the aforementioned topical anesthesia was applied to the right eye and then brought back to the Operating Room where the right eye was prepped and draped in the usual sterile ophthalmic fashion and a lid speculum was placed. A paracentesis was created with the side port blade and the anterior chamber was filled with viscoelastic. A near clear corneal incision was performed with the steel keratome. A continuous curvilinear capsulorrhexis was performed with a cystotome followed by the capsulorrhexis forceps. Hydrodissection and hydrodelineation were carried out with BSS on a blunt cannula. The lens was removed in a stop and chop  technique and the remaining cortical material was removed with the irrigation-aspiration handpiece. The capsular bag was inflated with viscoelastic and the Technis [id number]  lens was placed in the capsular bag without complication. The remaining viscoelastic was removed from the eye with the irrigation-aspiration handpiece. The wounds were hydrated. The anterior chamber was flushed with BSS and the eye was inflated to physiologic pressure. 0.127mof Vigamox was placed in the anterior chamber. The wounds were found to be water tight. The eye was dressed with Combigan. The patient was given protective glasses to wear throughout the day and a shield with which to sleep tonight. The patient was also given drops with which to begin a drop regimen today and will  follow-up with me in one day. ?Implant Name Type Inv. Item Serial No. Manufacturer Lot No. LRB No. Used Action  ?LENS IOL TECNIS EYHANCE 19.0 - [id number] Lens LENS IOL TECNIS EYHANCE 19.0 [id number]  Right 1 Implanted  ? ?Procedure(s) with comments: ?CATARACT EXTRACTION PHACO AND INTRAOCULAR LENS PLACEMENT (IOC) RIGHT (Right) - 2.94 ?0:25.2 ? ?Electronically signed: WiBirder Robson/14/2023 9:59 AM ? ?

## 2021-03-23 ENCOUNTER — Encounter: Payer: Self-pay | Admitting: Ophthalmology

## 2021-04-04 ENCOUNTER — Ambulatory Visit: Payer: Commercial Managed Care - PPO | Admitting: Family Medicine

## 2021-04-12 NOTE — Progress Notes (Signed)
?  ? ? ?  Established patient visit ? ?I,April Miller,acting as a scribe for Wilhemena Durie, MD.,have documented all relevant documentation on the behalf of Wilhemena Durie, MD,as directed by  Wilhemena Durie, MD while in the presence of Wilhemena Durie, MD. ? ? ?Patient: Danielle Davenport   DOB: 09/22/1955   66 y.o. Female  MRN: [id number] ?Visit Date: 04/18/2021 ? ?Today's healthcare provider: Wilhemena Durie, MD  ? ?Chief Complaint  ?Patient presents with  ? Follow-up  ? ?Subjective  ?  ?HPI  ?Patient is here for 6 month follow up.  Patient feels well.  Stress at home with husband with failing health.  She continues to work full-time. ? ?Medications: ?Outpatient Medications Prior to Visit  ?Medication Sig  ? acetaminophen (TYLENOL) 325 MG tablet Take by mouth.  ? ALPRAZolam (XANAX) 0.25 MG tablet Take 1 tablet (0.25 mg total) by mouth at bedtime as needed for anxiety.  ? aspirin 81 MG chewable tablet Chew by mouth daily.  ? ibuprofen (ADVIL,MOTRIN) 800 MG tablet Take by mouth.  ? levothyroxine (SYNTHROID) 75 MCG tablet TAKE 1 TABLET BY MOUTH ONCE A DAY  ? Multiple Vitamins-Minerals (CENTRUM SILVER) CHEW Chew by mouth.  ? Multiple Vitamins-Minerals (ZINC PO) Take 1 tablet by mouth.  ? Omega-3 Fatty Acids (FISH OIL) 1000 MG CAPS Take by mouth.  ? vitamin E 180 MG (400 UNITS) capsule Take 400 Units by mouth daily.  ? ?No facility-administered medications prior to visit.  ? ? ?Review of Systems  ?Constitutional:  Negative for appetite change, chills, fatigue and fever.  ?Respiratory:  Negative for chest tightness and shortness of breath.   ?Cardiovascular:  Negative for chest pain and palpitations.  ?Gastrointestinal:  Negative for abdominal pain, nausea and vomiting.  ?Neurological:  Negative for dizziness and weakness.  ? ?Last thyroid functions ?Lab Results  ?Component Value Date  ? TSH 1.570 10/04/2020  ? T4TOTAL 8.8 03/13/2018  ? ?  ?  Objective  ?  ?BP (!) 147/87 (BP Location: Right Arm,  Patient Position: Sitting, Cuff Size: Large)   Pulse 78   Temp 98.2 ?F (36.8 ?C) (Temporal)   Resp 16   Ht _0  (1.6 m)   Wt 213 lb (96.6 kg)   SpO2 99%   BMI 37.73 kg/m?  ?BP Readings from Last 3 Encounters:  ?04/18/21 (!) 147/87  ?03/22/21 (!) 176/97  ?03/08/21 (!) 151/86  ? ?Wt Readings from Last 3 Encounters:  ?04/18/21 213 lb (96.6 kg)  ?03/22/21 208 lb (94.3 kg)  ?03/08/21 207 lb (93.9 kg)  ? ?  ? ?  ? ? ?No results found for any visits on 04/18/21. ? Assessment & Plan  ?  ? ?1. Hypertension, unspecified type ?Start amlodipine 5 mg.  Follow-up this summer. ?- amLODipine (NORVASC) 5 MG tablet; Take 1 tablet (5 mg total) by mouth daily.  Dispense: 90 tablet; Refill: 1 ? ?2. Class 2 obesity due to excess calories without serious comorbidity with body mass index (BMI) of 37.0 to 37.9 in adult ?Had an exercise stress ? ?3. Lymphedema ? ? ?4. BRCA negative ? ? ? ?Return in about 3 months (around 07/18/2021).  ?   ? ?I, Wilhemena Durie, MD, have reviewed all documentation for this visit. The documentation on 04/21/21 for the exam, diagnosis, procedures, and orders are all accurate and complete. ? ? ? ?Maricella Filyaw Cranford Mon, MD  ?[facility] ?[phone] (phone) ?[phone] (fax) ? ?[facility] ?

## 2021-04-18 ENCOUNTER — Ambulatory Visit (INDEPENDENT_AMBULATORY_CARE_PROVIDER_SITE_OTHER): Payer: Commercial Managed Care - PPO | Admitting: Family Medicine

## 2021-04-18 ENCOUNTER — Encounter: Payer: Self-pay | Admitting: Family Medicine

## 2021-04-18 VITALS — BP 147/87 | HR 78 | Temp 98.2°F | Resp 16 | Ht 63.0 in | Wt 213.0 lb

## 2021-04-18 DIAGNOSIS — E6609 Other obesity due to excess calories: Secondary | ICD-10-CM

## 2021-04-18 DIAGNOSIS — I890 Lymphedema, not elsewhere classified: Secondary | ICD-10-CM | POA: Diagnosis not present

## 2021-04-18 DIAGNOSIS — Z6837 Body mass index (BMI) 37.0-37.9, adult: Secondary | ICD-10-CM

## 2021-04-18 DIAGNOSIS — Z1371 Encounter for nonprocreative screening for genetic disease carrier status: Secondary | ICD-10-CM | POA: Diagnosis not present

## 2021-04-18 DIAGNOSIS — I10 Essential (primary) hypertension: Secondary | ICD-10-CM

## 2021-04-18 MED ORDER — AMLODIPINE BESYLATE 5 MG PO TABS: 5.0000 mg | ORAL_TABLET | Freq: Every day | ORAL | 1 refills | Status: DC

## 2021-05-17 ENCOUNTER — Emergency Department: Payer: Commercial Managed Care - PPO

## 2021-05-17 ENCOUNTER — Emergency Department
Admission: EM | Admit: 2021-05-17 | Discharge: 2021-05-17 | Disposition: A | Discharge status: Home / Self Care | Payer: Commercial Managed Care - PPO | Attending: Emergency Medicine | Admitting: Emergency Medicine

## 2021-05-17 ENCOUNTER — Other Ambulatory Visit: Payer: Self-pay

## 2021-05-17 ENCOUNTER — Encounter: Payer: Self-pay | Admitting: Emergency Medicine

## 2021-05-17 DIAGNOSIS — M545 Low back pain, unspecified: Secondary | ICD-10-CM | POA: Diagnosis not present

## 2021-05-17 DIAGNOSIS — N3000 Acute cystitis without hematuria: Secondary | ICD-10-CM | POA: Diagnosis not present

## 2021-05-17 DIAGNOSIS — M25561 Pain in right knee: Secondary | ICD-10-CM | POA: Insufficient documentation

## 2021-05-17 DIAGNOSIS — R1031 Right lower quadrant pain: Secondary | ICD-10-CM | POA: Diagnosis present

## 2021-05-17 LAB — URINALYSIS, ROUTINE W REFLEX MICROSCOPIC
Bilirubin Urine: NEGATIVE
Glucose, UA: NEGATIVE mg/dL
Ketones, ur: NEGATIVE mg/dL
Nitrite: NEGATIVE
Protein, ur: NEGATIVE mg/dL
Specific Gravity, Urine: 1.01 (ref 1.005–1.030)
pH: 5 (ref 5.0–8.0)

## 2021-05-17 MED ORDER — CEPHALEXIN 500 MG PO CAPS: 500.0000 mg | ORAL_CAPSULE | Freq: Three times a day (TID) | ORAL | 0 refills | Status: DC

## 2021-05-17 MED ORDER — CEPHALEXIN 500 MG PO CAPS
500.0000 mg | ORAL_CAPSULE | Freq: Once | ORAL | Status: AC
Start: 2021-05-17 — End: 2021-05-17
  Administered 2021-05-17: 500 mg via ORAL
  Filled 2021-05-17: qty 1

## 2021-05-17 NOTE — ED Provider Notes (Signed)
? ?[facility] ?Provider Note ? ? ? Event Date/Time  ? First MD Initiated Contact with Patient 05/17/21 1519   ?  (approximate) ? ? ?History  ? ?Back Pain ? ? ?HPI ? ?CHARLIENE INOUE is a 66 y.o. female with a history of PAD, GERD, diverticulosis, kidney stones who comes ED complaining of right-sided back pain and right knee pain.  Back pain radiates to anterior abdomen.  Started late this morning, waxing waning, worse with standing upright and walking, no alleviating factors.  At its worst was 10/10 but now is 4/10.  No lower extremity weakness.  No fever vomiting diarrhea or constipation. ?  ? ? ?Physical Exam  ? ?Triage Vital Signs: ?ED Triage Vitals  ?Enc Vitals Group  ?   BP 05/17/21 1357 (!) 138/92  ?   Pulse Rate 05/17/21 1357 84  ?   Resp 05/17/21 1357 18  ?   Temp 05/17/21 1357 98.6 ?F (37 ?C)  ?   Temp Source 05/17/21 1357 Oral  ?   SpO2 05/17/21 1357 94 %  ?   Weight 05/17/21 1359 203 lb (92.1 kg)  ?   Height 05/17/21 1359 '5\' 3"'$  (1.6 m)  ?   Head Circumference --   ?   Peak Flow --   ?   Pain Score 05/17/21 1359 7  ?   Pain Loc --   ?   Pain Edu? --   ?   Excl. in [place]? --   ? ? ?Most recent vital signs: ?Vitals:  ? 05/17/21 1357  ?BP: (!) 138/92  ?Pulse: 84  ?Resp: 18  ?Temp: 98.6 ?F (37 ?C)  ?SpO2: 94%  ? ? ? ?General: Awake, no distress.  ?CV:  Good peripheral perfusion.  ?Resp:  Normal effort.  ?Abd:  No distention.  Soft with mild right lower quadrant tenderness, not focal to McBurney's point.  No hernia mass. ?Other:  No lower extremity edema.  Symmetric calf circumference, no calf tenderness.  No tenderness at the right knee or instability.  No appreciable joint effusion.  There is a large varicose vein on the right medial thigh which is not tender, no signs of inflammation or thrombosis..  There is some tenderness of the right lower back musculature which does reproduce pain ? ? ?ED Results / Procedures / Treatments  ? ?Labs ?(all labs ordered are listed, but only  abnormal results are displayed) ?Labs Reviewed  ?URINALYSIS, ROUTINE W REFLEX MICROSCOPIC - Abnormal; Notable for the following components:  ?    Result Value  ? Color, Urine YELLOW (*)   ? APPearance HAZY (*)   ? Hgb urine dipstick SMALL (*)   ? Leukocytes,Ua LARGE (*)   ? Bacteria, UA RARE (*)   ? All other components within normal limits  ?URINE CULTURE  ? ? ? ?EKG ? ? ? ? ?RADIOLOGY ?CT abdomen pelvis viewed and interpreted by me, negative for appendicitis, diverticulitis, free air.  No ureteral obstruction/hydronephrosis.  Radiology report reviewed ? ? ? ?PROCEDURES: ? ?Critical Care performed: No ? ?Procedures ? ? ?MEDICATIONS ORDERED IN ED: ?Medications  ?cephALEXin (KEFLEX) capsule 500 mg (has no administration in time range)  ? ? ? ?IMPRESSION / MDM / ASSESSMENT AND PLAN / ED COURSE  ?I reviewed the triage vital signs and the nursing notes. ?             ?               ? ?Differential  diagnosis includes, but is not limited to, appendicitis, ureterolithiasis, cystitis, musculoskeletal spasm, ventral hernia ? ?Patient presents with right lower quadrant pain and tenderness.  Vital signs unremarkable, labs unremarkable.  Urinalysis suggestive of UTI.  CT is unremarkable.  Doubt dissection or AAA.  Doubt ovarian torsion or PID or pelvic abscess.  With otherwise reassuring work-up and exam, will treat for cystitis, recommend follow-up with primary care. ? ? ?  ? ? ?FINAL CLINICAL IMPRESSION(S) / ED DIAGNOSES  ? ?Final diagnoses:  ?Acute cystitis without hematuria  ? ? ? ?Rx / DC Orders  ? ?ED Discharge Orders   ? ?      Ordered  ?  cephALEXin (KEFLEX) 500 MG capsule  3 times daily       ? 05/17/21 1706  ? ?  ?  ? ?  ? ? ? ?Note:  This document was prepared using Dragon voice recognition software and may include unintentional dictation errors. ?  ?Carrie Mew, MD ?05/17/21 1717 ? ?

## 2021-05-17 NOTE — ED Triage Notes (Signed)
Patient to ED via POV for back pain and right knee pain. Patient denying urinary symptoms at this time.  ?

## 2021-05-17 NOTE — ED Provider Triage Note (Signed)
?  Emergency Medicine Provider Triage Evaluation Note ? ?Danielle Davenport , a 66 y.o.female,  was evaluated in triage.  Pt complains of back pain and knee pain.  She states her knee has been hurting her for the past 24 to 48 hours, particular when she walks.  Additionally, she has been experiencing right-sided back pain that wraps around to her flank.  Denies any urinary symptoms this time, though does have a history of nephrolithiasis.  No recent illnesses or injuries. ? ? ?Review of Systems  ?Positive: Back pain, knee pain ?Negative: Denies fever, chest pain, vomiting ? ?Physical Exam  ? ?Vitals:  ? 05/17/21 1357  ?BP: (!) 138/92  ?Pulse: 84  ?Resp: 18  ?Temp: 98.6 ?F (37 ?C)  ?SpO2: 94%  ? ?Gen:   Awake, no distress   ?Resp:  Normal effort  ?MSK:   Moves extremities without difficulty  ?Other:  No CVA tenderness. ? ?Medical Decision Making  ?Given the patient's initial medical screening exam, the following diagnostic evaluation has been ordered. The patient will be placed in the appropriate treatment space, once one is available, to complete the evaluation and treatment. I have discussed the plan of care with the patient and I have advised the patient that an ED physician or mid-level practitioner will reevaluate their condition after the test results have been received, as the results may give them additional insight into the type of treatment they may need.  ? ? ?Diagnostics: Knee x-ray, urinalysis ? ?Treatments: none immediately ?  ?Teodoro Spray, PA ?05/17/21 1401 ? ?

## 2021-05-19 ENCOUNTER — Encounter: Payer: Self-pay | Admitting: Physician Assistant

## 2021-05-19 ENCOUNTER — Ambulatory Visit: Payer: Self-pay | Admitting: *Deleted

## 2021-05-19 ENCOUNTER — Ambulatory Visit (INDEPENDENT_AMBULATORY_CARE_PROVIDER_SITE_OTHER): Payer: Commercial Managed Care - PPO | Admitting: Physician Assistant

## 2021-05-19 VITALS — BP 146/75 | HR 69 | Temp 97.6°F | Resp 16 | Wt 207.8 lb

## 2021-05-19 DIAGNOSIS — R1031 Right lower quadrant pain: Secondary | ICD-10-CM

## 2021-05-19 DIAGNOSIS — R300 Dysuria: Secondary | ICD-10-CM

## 2021-05-19 LAB — POCT URINALYSIS DIPSTICK
Bilirubin, UA: NEGATIVE
Glucose, UA: NEGATIVE
Ketones, UA: NEGATIVE
Nitrite, UA: NEGATIVE
Protein, UA: NEGATIVE
Spec Grav, UA: >=1.03 — AB (ref 1.010–1.025)
Urobilinogen, UA: 0.2 E.U./dL
pH, UA: 6 (ref 5.0–8.0)

## 2021-05-19 LAB — URINE CULTURE: Culture: <10000 — AB

## 2021-05-19 NOTE — Telephone Encounter (Signed)
Patient was seen in office today.  

## 2021-05-19 NOTE — Progress Notes (Signed)
?  ? ?I,Matelyn Antonelli Robinson,acting as a Education administrator for [employer], PA-C.,have documented all relevant documentation on the behalf of Mardene Speak, PA-C,as directed by  [employer], PA-C while in the presence of [employer], PA-C. ? ? ?Established patient visit ? ? ?Patient: Danielle Davenport   DOB: 1955-01-13   66 y.o. Female  MRN: [id number] ?Visit Date: 05/19/2021 ? ?Today's healthcare provider: Mardene Speak, PA-C  ? ?Chief Complaint  ?Patient presents with  ? Back Pain  ? ?Subjective  ?  ?HPI  ?Patient presents for back pain/lower right side that radiates to side of pelvic with pressure in urethra.  Onset Tuesday 05-17-21. Seen in ED on Tuesday and diagnosed with acute cystitis.  Patient has been taking Keflex.  CT showed may have passed stone but did not see any other stones.  Pain is worse with walking and states she does a lot of lifting at work. Pain scores from 4-5/10 at rest to 7-8/10 with walking. Taking Ibuprofen and Tylenol with mild relief. At work , she is walking a lot and lifting some weight, she is Multimedia programmer. Patient was feeling better until she went to work this morning and pain returned. 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 ?Medications: ?Outpatient Medications Prior to Visit  ?Medication Sig  ? acetaminophen (TYLENOL) 325 MG tablet Take by mouth.  ? ALPRAZolam (XANAX) 0.25 MG tablet Take 1 tablet (0.25 mg total) by mouth at bedtime as needed for anxiety.  ? amLODipine (NORVASC) 5 MG tablet Take 1 tablet (5 mg total) by mouth daily.  ? cephALEXin (KEFLEX) 500 MG capsule Take 1 capsule (500 mg total) by mouth 3 (three) times daily.  ? ibuprofen (ADVIL,MOTRIN) 800 MG tablet Take by mouth.  ? levothyroxine (SYNTHROID) 75 MCG tablet TAKE 1 TABLET BY MOUTH ONCE A DAY  ? Multiple Vitamins-Minerals (CENTRUM SILVER) CHEW Chew by mouth.  ? Multiple Vitamins-Minerals (ZINC PO) Take 1 tablet  by mouth.  ? Omega-3 Fatty Acids (FISH OIL) 1000 MG CAPS Take by mouth.  ? vitamin E 180 MG (400 UNITS) capsule Take 400 Units by mouth daily.  ? aspirin 81 MG chewable tablet Chew by mouth daily. (Patient not taking: Reported on 05/19/2021)  ? ?No facility-administered medications prior to visit.  ? ? ?Review of Systems  ?Constitutional:  Negative for chills and fever.  ?Respiratory: Negative.    ?Cardiovascular: Negative.   ?Gastrointestinal:  Positive for abdominal pain. Negative for blood in stool, diarrhea and nausea.  ?Genitourinary:  Positive for dysuria and flank pain.  ? ? ?  Objective  ?  ?BP (!) 146/75 (BP Location: Right Arm, Patient Position: Sitting, Cuff Size: Normal)   Pulse 69   Temp 97.6 ?F (36.4 ?C) (Oral)   Resp 16   Wt 207 lb 12.8 oz (94.3 kg)   SpO2 100%   BMI 36.81 kg/m?  ? ? ?Physical Exam ?Vitals reviewed.  ?Constitutional:   ?   General: She is in acute distress.  ?   Appearance: Normal appearance. She is obese.  ?HENT:  ?   Head: Normocephalic and atraumatic.  ?Cardiovascular:  ?   Rate and Rhythm: Normal rate and regular rhythm.  ?   Pulses: Normal pulses.  ?   Heart sounds: Normal heart sounds.  ?Pulmonary:  ?   Effort: Pulmonary effort is normal.  ?   Breath sounds: Normal breath sounds.  ?  Abdominal:  ?   General: There is no distension.  ?   Palpations: There is no mass.  ?   Tenderness: There is abdominal tenderness (RLQ). There is right CVA tenderness (mild). There is no left CVA tenderness or rebound.  ?Neurological:  ?   Mental Status: She is alert.  ? Negative straight/crossover leg raise tests ? ? ?No results found for any visits on 05/19/21. ? Assessment & Plan  ?  ? ?1. Dysuria ?Due to possible cystitis? Pyelonephritis? Continue Keflex. Drink a lot of fluids ?- Urinalysis, microscopic only ?- Urine Culture ?- POCT Urinalysis Dipstick ?Will reassess next Monday ? ?2. RLQ abdominal pain/Back pain ?Recent CT results from ?05/17/21  were reassuring:  ?1. No acute findings in the  abdomen or pelvis. No renal stones or ?hydroureteronephrosis identified. ?2. Diverticulosis without evidence of acute diverticulitis. ?3. Aortic Atherosclerosis  ?Continue to take ibuprofen up to 600-'800mg'$  with meal and vit B as needed ? ?3. Hematuria ?Some traces of blood identified in urine. ?Could be due to passed kidney stone or UTI ?Might need to consider a referral to Urology if hematuria will persist ? ?Will reassess on Monday. ?FU as scheduled ?   ?The patient was advised to call back or seek an in-person evaluation if the symptoms worsen or if the condition fails to improve as anticipated. ? ?I discussed the assessment and treatment plan with the patient. The patient was provided an opportunity to ask questions and all were answered. The patient agreed with the plan and demonstrated an understanding of the instructions. ? ?The entirety of the information documented in the History of Present Illness, Review of Systems and Physical Exam were personally obtained by me. Portions of this information were initially documented by the CMA and reviewed by me for thoroughness and accuracy.  ?Portions of this note were created using dictation software and may contain typographical errors.  ? ? ?Mardene Speak, PA-C  ?[place] ?[phone] (phone) ?[phone] (fax) ? ?[facility] ?

## 2021-05-19 NOTE — Telephone Encounter (Addendum)
?  Chief Complaint: Back pain ?Symptoms: Lower right sided back pain, radiates to side. Seen in ED Tuesday, "Acute cystitis,"on Keflex. CT showed may have passed stone but did not see any other stones. States "ED saido appendicitis". Rates at 6/10, worse when ambulating.  ?Frequency: Tuesday 05/17/21 ?Pertinent Negatives: Patient denies fever ?Disposition: '[]'$ ED /'[]'$ Urgent Care (no appt availability in office) / '[x]'$ Appointment(In office/virtual)/ '[]'$  [facility]/ '[]'$ Home Care/ '[]'$ Refused Recommended Disposition /'[]'$ [facility]/ '[]'$  Follow-up with PCP ?Additional Notes: Appt secured for this afternoon. Care advise provided, verbalizes understanding.  ? Reason for Disposition ? [1] SEVERE back pain (e.g., excruciating, unable to do any normal activities) AND [2] not improved 2 hours after pain medicine ? ?Answer Assessment - Initial Assessment Questions ?1. ONSET: "When did the pain begin?"  ?    Tuesday 5/9 ?2. LOCATION: "Where does it hurt?" (upper, mid or lower back) ?    Lower right side of back, radiates to side ?3. SEVERITY: "How bad is the pain?"  (e.g., Scale 1-10; mild, moderate, or severe) ?  - MILD (1-3): doesn't interfere with normal activities  ?  - MODERATE (4-7): interferes with normal activities or awakens from sleep  ?  - SEVERE (8-10): excruciating pain, unable to do any normal activities  ?    6/10, varies, When walking 8-10/10 ?4. PATTERN: "Is the pain constant?" (e.g., yes, no; constant, intermittent)  ?    constant ?5. RADIATION: "Does the pain shoot into your legs or elsewhere?" ?    To side ?6. CAUSE:  "What do you think is causing the back pain?"  ?    Maybe UTI ?7. BACK OVERUSE:  "Any recent lifting of heavy objects, strenuous work or exercise?" ?    Lift at work all the time ?8. MEDICATIONS: "What have you taken so far for the pain?" (e.g., nothing, acetaminophen, NSAIDS) ?    Keflex for UTI from ED. IBU and tylenol ?9. NEUROLOGIC SYMPTOMS: "Do you have any weakness, numbness,  or problems with bowel/bladder control?" ?    Has UTI ?10. OTHER SYMPTOMS: "Do you have any other symptoms?" (e.g., fever, abdominal pain, burning with urination, blood in urine) ?      Frequency ? ?Protocols used: Back Pain-A-AH ? ?

## 2021-05-20 LAB — COMPREHENSIVE METABOLIC PANEL
ALT: 20 IU/L (ref 0–32)
AST: 18 IU/L (ref 0–40)
Albumin/Globulin Ratio: 1.8 (ref 1.2–2.2)
Albumin: 4.9 g/dL — ABNORMAL HIGH (ref 3.8–4.8)
Alkaline Phosphatase: 124 IU/L — ABNORMAL HIGH (ref 44–121)
BUN/Creatinine Ratio: 14 (ref 12–28)
BUN: 9 mg/dL (ref 8–27)
Bilirubin Total: 0.3 mg/dL (ref 0.0–1.2)
CO2: 22 mmol/L (ref 20–29)
Calcium: 9.7 mg/dL (ref 8.7–10.3)
Chloride: 104 mmol/L (ref 96–106)
Creatinine, Ser: 0.66 mg/dL (ref 0.57–1.00)
Globulin, Total: 2.8 g/dL (ref 1.5–4.5)
Glucose: 90 mg/dL (ref 70–99)
Potassium: 4.2 mmol/L (ref 3.5–5.2)
Sodium: 142 mmol/L (ref 134–144)
Total Protein: 7.7 g/dL (ref 6.0–8.5)
eGFR: 97 mL/min/{1.73_m2} (ref >59)

## 2021-05-20 LAB — CBC WITH DIFFERENTIAL/PLATELET
Basophils Absolute: 0.1 10*3/uL (ref 0.0–0.2)
Basos: 1 %
EOS (ABSOLUTE): 0.2 10*3/uL (ref 0.0–0.4)
Eos: 2 %
Hematocrit: 40.7 % (ref 34.0–46.6)
Hemoglobin: 14 g/dL (ref 11.1–15.9)
Immature Grans (Abs): 0 10*3/uL (ref 0.0–0.1)
Immature Granulocytes: 0 %
Lymphocytes Absolute: 3.6 10*3/uL — ABNORMAL HIGH (ref 0.7–3.1)
Lymphs: 33 %
MCH: 29 pg (ref 26.6–33.0)
MCHC: 34.4 g/dL (ref 31.5–35.7)
MCV: 84 fL (ref 79–97)
Monocytes Absolute: 0.8 10*3/uL (ref 0.1–0.9)
Monocytes: 8 %
Neutrophils Absolute: 6.1 10*3/uL (ref 1.4–7.0)
Neutrophils: 56 %
Platelets: 279 10*3/uL (ref 150–450)
RBC: 4.82 x10E6/uL (ref 3.77–5.28)
RDW: 13.3 % (ref 11.7–15.4)
WBC: 10.8 10*3/uL (ref 3.4–10.8)

## 2021-05-20 LAB — URINALYSIS, MICROSCOPIC ONLY
Casts: NONE SEEN /lpf
RBC, Urine: NONE SEEN /hpf (ref 0–2)

## 2021-05-20 LAB — SPECIMEN STATUS REPORT

## 2021-05-21 ENCOUNTER — Encounter: Payer: Self-pay | Admitting: Physician Assistant

## 2021-05-22 LAB — URINE CULTURE: Organism ID, Bacteria: NO GROWTH

## 2021-05-22 LAB — SPECIMEN STATUS REPORT

## 2021-05-23 ENCOUNTER — Encounter: Payer: Self-pay | Admitting: Physician Assistant

## 2021-05-23 ENCOUNTER — Ambulatory Visit (INDEPENDENT_AMBULATORY_CARE_PROVIDER_SITE_OTHER): Payer: Commercial Managed Care - PPO | Admitting: Physician Assistant

## 2021-05-23 VITALS — BP 124/65 | HR 75 | Temp 97.9°F | Resp 16 | Wt 208.6 lb

## 2021-05-23 DIAGNOSIS — R300 Dysuria: Secondary | ICD-10-CM

## 2021-05-23 LAB — POCT URINALYSIS DIPSTICK
Bilirubin, UA: NEGATIVE
Blood, UA: NEGATIVE
Glucose, UA: NEGATIVE
Ketones, UA: NEGATIVE
Leukocytes, UA: NEGATIVE
Nitrite, UA: NEGATIVE
Protein, UA: NEGATIVE
Spec Grav, UA: >=1.03 — AB (ref 1.010–1.025)
Urobilinogen, UA: 0.2 E.U./dL
pH, UA: 5.5 (ref 5.0–8.0)

## 2021-05-23 MED ORDER — CIPROFLOXACIN HCL 500 MG PO TABS: 500.0000 mg | ORAL_TABLET | Freq: Two times a day (BID) | ORAL | 0 refills | Status: AC

## 2021-05-23 NOTE — Progress Notes (Signed)
?  ? ? ?I,Danielle Davenport,acting as a Education administrator for [employer], PA-C.,have documented all relevant documentation on the behalf of Danielle Speak, PA-C,as directed by  [employer], PA-C while in the presence of [employer], PA-C. ? ?Established patient visit ? ? ?Patient: Danielle Davenport   DOB: 1955/11/02   66 y.o. Female  MRN: [id number] ?Visit Date: 05/23/2021 ? ?Today's healthcare provider: Mardene Speak, PA-C  ? ?FU for dysuria/pelvic pain/back pain ? ?Subjective  ?  ?HPI  ?Follow up for dysuria / back pain ? ?The patient was last seen for this 4 days ago. ?Changes made at last visit include:Continue Keflex. Drink a lot of fluids, Continue to take ibuprofen up to 600-'800mg'$  with meal and vit B as needed, consider a referral to Urology if hematuria will persist. ?Patient did well during the weekend, pain subsided but still has "pressure." ?Got lots of rest, liquids over the weekend.  Continues to use AZO. Continued pressure.  ?Pain returned back today/after she returned back to work. ?Had a fever and a sore throat on Friday. Used warm salt gargle. Symptoms resolved Not fever today. ? ?She reports excellent compliance with treatment. ?Takes Keflex. ? ? ?----------------------------------------------------------------------------------------- ? ? ?Medications: ?Outpatient Medications Prior to Visit  ?Medication Sig  ? acetaminophen (TYLENOL) 325 MG tablet Take by mouth.  ? ALPRAZolam (XANAX) 0.25 MG tablet Take 1 tablet (0.25 mg total) by mouth at bedtime as needed for anxiety.  ? amLODipine (NORVASC) 5 MG tablet Take 1 tablet (5 mg total) by mouth daily.  ? aspirin 81 MG chewable tablet Chew by mouth daily. (Patient not taking: Reported on 05/19/2021)  ? cephALEXin (KEFLEX) 500 MG capsule Take 1 capsule (500 mg total) by mouth 3 (three) times daily.  ? ibuprofen (ADVIL,MOTRIN) 800 MG tablet Take by mouth.  ? levothyroxine (SYNTHROID) 75 MCG tablet TAKE 1 TABLET BY MOUTH ONCE A DAY  ? Multiple Vitamins-Minerals  (CENTRUM SILVER) CHEW Chew by mouth.  ? Multiple Vitamins-Minerals (ZINC PO) Take 1 tablet by mouth.  ? Omega-3 Fatty Acids (FISH OIL) 1000 MG CAPS Take by mouth.  ? vitamin E 180 MG (400 UNITS) capsule Take 400 Units by mouth daily.  ? ?No facility-administered medications prior to visit.  ? ? ?Review of Systems  ?Constitutional:  Negative for chills and fever.  ?Genitourinary:  Positive for dysuria and flank pain. Negative for hematuria, vaginal bleeding, vaginal discharge and vaginal pain.  ?Musculoskeletal:  Positive for back pain.  ?Psychiatric/Behavioral:  Positive for dysphoric mood.   ? ? ?  Objective  ?  ?There were no vitals taken for this visit. ? ? ?Physical Exam ?Vitals reviewed.  ?Constitutional:   ?   General: She is in acute distress.  ?   Appearance: Normal appearance. She is well-developed. She is not diaphoretic.  ?HENT:  ?   Head: Normocephalic and atraumatic.  ?Eyes:  ?   General: No scleral icterus. ?   Conjunctiva/sclera: Conjunctivae normal.  ?Neck:  ?   Thyroid: No thyromegaly.  ?Cardiovascular:  ?   Rate and Rhythm: Normal rate and regular rhythm.  ?   Pulses: Normal pulses.  ?   Heart sounds: Normal heart sounds. No murmur heard. ?Pulmonary:  ?   Effort: Pulmonary effort is normal. No respiratory distress.  ?   Breath sounds: Normal breath sounds. No wheezing, rhonchi or rales.  ?Abdominal:  ?   General: Bowel sounds are normal. There is no distension.  ?   Palpations: Abdomen is soft.  ?  Tenderness: There is abdominal tenderness in the suprapubic area. There is right CVA tenderness. There is no guarding or rebound.  ?Musculoskeletal:  ?   Cervical back: Neck supple.  ?   Right lower leg: No edema.  ?   Left lower leg: No edema.  ?Lymphadenopathy:  ?   Cervical: No cervical adenopathy.  ?Skin: ?   General: Skin is warm and dry.  ?   Capillary Refill: Capillary refill takes less than 2 seconds.  ?   Findings: No rash.  ?Neurological:  ?   Mental Status: She is alert and oriented to  person, place, and time. Mental status is at baseline.  ?Psychiatric:     ?   Mood and Affect: Mood normal.     ?   Behavior: Behavior normal.  ? ? ?No results found for any visits on 05/23/21. ? Assessment & Plan  ?  ? ?1. Dysuria ?Normal vitals today. No fever. Had a fever on Friday. ?- POCT Urinalysis Dipstick ?Trace of leuko found ?Patient is still working. Sent a work note for today. ?Reassuring. Continue Keflex ?Suspected kidney infection? Cystitis? Back pain? ?If patient will not improve on Keflex, start on Cipro.  High risk for C difficile was explained. Pt expressed an understanding and agreed with treatment plan. ?Patient was instructed not to fill it unless ?their symptoms worsen  ?Discussed the potential negative side effects of antibiotics and that they can lead to ?resistance if used unnecessarily ?Discussed expectations for duration of symptoms.  ?Made a plan with the patient to outline what to do if symptoms worsen or do not ?improve, or if they develop concerning symptoms like high fever ? ?Discussed side effects of antibiotics. ? ? ?Fu in 9 days ?   ?The patient was advised to call back or seek an in-person evaluation if the symptoms worsen or if the condition fails to improve as anticipated. ? ?I discussed the assessment and treatment plan with the patient. The patient was provided an opportunity to ask questions and all were answered. The patient agreed with the plan and demonstrated an understanding of the instructions. ? ?The entirety of the information documented in the History of Present Illness, Review of Systems and Physical Exam were personally obtained by me. Portions of this information were initially documented by the CMA and reviewed by me for thoroughness and accuracy.  ?Portions of this note were created using dictation software and may contain typographical errors.  ? ? ? ?Danielle Speak, PA-C  ?[place] ?[phone] (phone) ?[phone] (fax) ? ?[facility] ?

## 2021-05-25 ENCOUNTER — Encounter: Payer: Self-pay | Admitting: Physician Assistant

## 2021-05-26 ENCOUNTER — Ambulatory Visit: Payer: Commercial Managed Care - PPO | Admitting: Physician Assistant

## 2021-06-01 ENCOUNTER — Ambulatory Visit: Payer: Commercial Managed Care - PPO | Admitting: Physician Assistant

## 2021-06-01 ENCOUNTER — Encounter: Payer: Self-pay | Admitting: Physician Assistant

## 2021-06-01 ENCOUNTER — Telehealth: Payer: Self-pay

## 2021-06-01 VITALS — BP 128/67 | HR 75 | Resp 16 | Wt 206.8 lb

## 2021-06-01 DIAGNOSIS — R102 Pelvic and perineal pain: Secondary | ICD-10-CM

## 2021-06-01 DIAGNOSIS — R1031 Right lower quadrant pain: Secondary | ICD-10-CM

## 2021-06-01 DIAGNOSIS — R300 Dysuria: Secondary | ICD-10-CM | POA: Diagnosis not present

## 2021-06-01 DIAGNOSIS — M545 Low back pain, unspecified: Secondary | ICD-10-CM | POA: Diagnosis not present

## 2021-06-01 NOTE — Telephone Encounter (Signed)
-----   Message from Mardene Speak, [place] sent at 06/01/2021  1:02 PM EDT ----- Please pt know that I filled out her FMLA form. Thank you

## 2021-06-01 NOTE — Telephone Encounter (Signed)
Patient aware. Will come in the morning to pick them up.

## 2021-06-01 NOTE — Progress Notes (Signed)
I,[name],acting as a Education administrator for [employer], PA-C.,have documented all relevant documentation on the behalf of Mardene Speak, PA-C,as directed by  [employer], PA-C while in the presence of [employer], PA-C.   Established patient visit   Patient: Danielle Davenport   DOB: [birth year]   66 y.o. Female  MRN: [id number] Visit Date: 06/01/2021  Today's healthcare provider: Mardene Speak, PA-C   Chief Complaint  Patient presents with  . Folow-up Dysuria   Subjective    HPI  Follow up for Dysuria/ suprapubic/pelvic pain  The patient was last seen for this 9 days ago. Changes made at last visit include continue Keflex if no improvement on Keflex start Cipro. Patient started Cipro on 05/25/2021. Would like to be refer to Urology.  She reports good compliance with treatment. She feels that condition is  improved some .She still having pressure in her lower abdominal area. She has some "pinching""stabbing" pain on right side of back with discomfort on her RLQ of abdomen. She is not having side effects.  Patient has FMLA paper work to be fill out. -----------------------------------------------------------------------------------------   Medications: Outpatient Medications Prior to Visit  Medication Sig  . acetaminophen (TYLENOL) 325 MG tablet Take by mouth.  . ALPRAZolam (XANAX) 0.25 MG tablet Take 1 tablet (0.25 mg total) by mouth at bedtime as needed for anxiety.  Marland Kitchen amLODipine (NORVASC) 5 MG tablet Take 1 tablet (5 mg total) by mouth daily.  Marland Kitchen aspirin 81 MG chewable tablet Chew by mouth daily.  . ciprofloxacin (CIPRO) 500 MG tablet Take 1 tablet (500 mg total) by mouth 2 (two) times daily for 10 days.  Marland Kitchen ibuprofen (ADVIL,MOTRIN) 800 MG tablet Take by mouth.  . levothyroxine (SYNTHROID) 75 MCG tablet TAKE 1 TABLET BY MOUTH ONCE A DAY  . Multiple Vitamins-Minerals (CENTRUM SILVER) CHEW Chew by mouth.  . Multiple Vitamins-Minerals (ZINC PO) Take 1 tablet by mouth.   . Omega-3 Fatty Acids (FISH OIL) 1000 MG CAPS Take by mouth.  . vitamin E 180 MG (400 UNITS) capsule Take 400 Units by mouth daily.  . cephALEXin (KEFLEX) 500 MG capsule Take 1 capsule (500 mg total) by mouth 3 (three) times daily. (Patient not taking: Reported on 06/01/2021)   No facility-administered medications prior to visit.    Review of Systems  Respiratory: Negative.    Cardiovascular: Negative.   Gastrointestinal:  Positive for abdominal pain. Negative for abdominal distention, blood in stool, constipation, diarrhea, nausea and vomiting.   {Labs  Heme  Chem  Endocrine  Serology  Results Review (optional):23779}   Objective    BP 128/67 (BP Location: Right Arm, Patient Position: Sitting, Cuff Size: Large)   Pulse 75   Resp 16   Wt 206 lb 12.8 oz (93.8 kg)   BMI 36.63 kg/m  {Show previous vital signs (optional):23777}  Physical Exam Vitals reviewed.  Constitutional:      Appearance: Normal appearance. She is obese.  Cardiovascular:     Rate and Rhythm: Normal rate and regular rhythm.     Pulses: Normal pulses.     Heart sounds: Normal heart sounds.  Pulmonary:     Effort: Pulmonary effort is normal.     Breath sounds: Normal breath sounds.  Abdominal:     General: Abdomen is flat. Bowel sounds are normal.     Tenderness: There is abdominal tenderness (mild RLQ pain, pelvic, suprapubic). There is right CVA tenderness (mild).  Musculoskeletal:        General: Normal  range of motion.     Cervical back: Normal range of motion.  Skin:    Capillary Refill: Capillary refill takes less than 2 seconds.  Neurological:     General: No focal deficit present.     Mental Status: She is alert and oriented to person, place, and time.     Motor: No weakness.     Coordination: Coordination normal.     Gait: Gait normal.     Deep Tendon Reflexes: Reflexes normal.  Psychiatric:        Behavior: Behavior normal.        Thought Content: Thought content normal.         Judgment: Judgment normal.      No results found for any visits on 06/01/21.  Assessment & Plan     1. Suprapubic abdominal pain ***  2. Dysuria ***  3. RLQ abdominal pain ***  4. Acute right-sided low back pain without sciatica ***  5. Pelvic pain ***   FU as needed The patient was advised to call back or seek an in-person evaluation if the symptoms worsen or if the condition fails to improve as anticipated.  I discussed the assessment and treatment plan with the patient. The patient was provided an opportunity to ask questions and all were answered. The patient agreed with the plan and demonstrated an understanding of the instructions.  The entirety of the information documented in the History of Present Illness, Review of Systems and Physical Exam were personally obtained by me. Portions of this information were initially documented by the CMA and reviewed by me for thoroughness and accuracy.  Portions of this note were created using dictation software and may contain typographical errors.    Mardene Speak, PA-C  [facility] [phone] (phone) [phone] (fax)  [place]

## 2021-06-08 ENCOUNTER — Encounter: Payer: Self-pay | Admitting: Urology

## 2021-06-08 ENCOUNTER — Ambulatory Visit (INDEPENDENT_AMBULATORY_CARE_PROVIDER_SITE_OTHER): Payer: Commercial Managed Care - PPO | Admitting: Urology

## 2021-06-08 VITALS — BP 143/81 | HR 80 | Ht 63.0 in | Wt 209.0 lb

## 2021-06-08 DIAGNOSIS — R8281 Pyuria: Secondary | ICD-10-CM

## 2021-06-08 DIAGNOSIS — R3915 Urgency of urination: Secondary | ICD-10-CM | POA: Diagnosis not present

## 2021-06-08 DIAGNOSIS — R102 Pelvic and perineal pain: Secondary | ICD-10-CM | POA: Diagnosis not present

## 2021-06-08 DIAGNOSIS — R350 Frequency of micturition: Secondary | ICD-10-CM

## 2021-06-08 MED ORDER — GEMTESA 75 MG PO TABS
75.0000 mg | ORAL_TABLET | Freq: Every day | ORAL | 0 refills | Status: DC
Start: 2021-06-08 — End: 2021-10-05

## 2021-06-08 NOTE — Progress Notes (Signed)
06/08/2021 6:01 PM   Danielle Davenport 15-Jun-1955 [id number]  Referring provider: Mardene Speak, PA-C [address] [place],  [place] [address]  Chief Complaint  Patient presents with   Dysuria    HPI: Danielle Davenport is a 66 y.o. female referred for evaluation of pelvic pain.  Presented to [facility] ED 05/17/2021 complaining of right back pain which radiated to the anterior abdomen.  Pain was initially rated 10/10 but was 4/10 at the time of ED presentation Dip urinalysis showed large leukocytes and small blood.  Microscopy showed 11-20 WBC and 6-10 squamous epithelial cells.  Urine culture was negative Stone protocol CT showed no nephrolithiasis or ureteral calculi and she was discharged on cephalexin Seen in follow-up with PCP 05/19/2021 complaining of persistent symptoms along with pelvic pressure Repeat UA showed 6-10 WBC/0 RBC; urine culture negative Instructed to continue Keflex Persistent symptoms on follow-up 5/15 and she was started on Cipro with some improvement in her symptoms.  UA at that visit was negative. In follow-up PCP visit 5/24 was doing better and requested urology eval Her primary complaints are pelvic pressure and intermittent stabbing pain in the right pelvic area No gross hematuria or dysuria + Frequency and urgency History of "bladder tack" ~ 1999/2000   PMH: Past Medical History:  Diagnosis Date   Arthritis    BRCA negative    Colon polyp    Complication of anesthesia    slow to wake   Depression    Diverticulosis    Fatty liver    Functional disorder of stomach    Gastroduodenal ulcer    GERD (gastroesophageal reflux disease)    Liver disorder    Thyroid disease    Hypothyroid    Surgical History: Past Surgical History:  Procedure Laterality Date   BREAST BIOPSY Left 01/26/2021   stereo bx-calcs/?COIL" clip-path pending   BREAST EXCISIONAL BIOPSY Left 2000   benign   BREAST SURGERY     CATARACT EXTRACTION W/PHACO Left  03/08/2021   Procedure: CATARACT EXTRACTION PHACO AND INTRAOCULAR LENS PLACEMENT ([facility]) LEFT 3.23 00:20.3;  Surgeon: Birder Robson, MD;  Location: [facility];  Service: Ophthalmology;  Laterality: Left;   CATARACT EXTRACTION W/PHACO Right 03/22/2021   Procedure: CATARACT EXTRACTION PHACO AND INTRAOCULAR LENS PLACEMENT ([place]) RIGHT;  Surgeon: Birder Robson, MD;  Location: [facility];  Service: Ophthalmology;  Laterality: Right;  2.94 0:25.2   COLONOSCOPY WITH PROPOFOL N/A 11/06/2019   Procedure: COLONOSCOPY WITH PROPOFOL;  Surgeon: Virgel Manifold, MD;  Location: [facility];  Service: Endoscopy;  Laterality: N/A;   HYSTEROSCOPY     INCONTINENCE SURGERY     TUBAL LIGATION     UTERINE FIBROID SURGERY      Home Medications:  Allergies as of 06/08/2021       Reactions   Epinephrine Other (See Comments)   Was added to "numbing" injection for breast biopsy.  Caused "shakes" for a while afterwards.   Sertraline Hcl    uncontrollable bowels, grinding and clinching teeth.        Medication List        Accurate as of Jun 08, 2021  6:01 PM. If you have any questions, ask your nurse or doctor.          STOP taking these medications    cephALEXin 500 MG capsule Commonly known as: KEFLEX Stopped by: Abbie Sons, MD       TAKE these medications    acetaminophen 325 MG tablet Commonly known  as: TYLENOL Take by mouth.   ALPRAZolam 0.25 MG tablet Commonly known as: XANAX Take 1 tablet (0.25 mg total) by mouth at bedtime as needed for anxiety.   amLODipine 5 MG tablet Commonly known as: NORVASC Take 1 tablet (5 mg total) by mouth daily.   aspirin 81 MG chewable tablet Chew by mouth daily.   Centrum Silver Chew Chew by mouth.   Fish Oil 1000 MG Caps Take by mouth.   Gemtesa 75 MG Tabs Generic drug: Vibegron Take 75 mg by mouth daily. Started by: Abbie Sons, MD   ibuprofen 800 MG tablet Commonly known as: ADVIL Take by  mouth.   levothyroxine 75 MCG tablet Commonly known as: SYNTHROID TAKE 1 TABLET BY MOUTH ONCE A DAY   vitamin E 180 MG (400 UNITS) capsule Take 400 Units by mouth daily.   ZINC PO Take 1 tablet by mouth.        Allergies:  Allergies  Allergen Reactions   Epinephrine Other (See Comments)    Was added to "numbing" injection for breast biopsy.  Caused "shakes" for a while afterwards.   Sertraline Hcl     uncontrollable bowels, grinding and clinching teeth.    Family History: Family History  Problem Relation Age of Onset   Breast cancer Mother 19   Brain cancer Mother    Cirrhosis Father    COPD Brother    COPD Daughter    Emphysema Maternal Grandfather    Stroke Paternal Grandfather    Brain cancer Sister    Breast cancer Maternal Aunt    Stroke Maternal Uncle     Social History:  reports that she has never smoked. She has never used smokeless tobacco. She reports that she does not drink alcohol and does not use drugs.   Physical Exam: BP (!) 143/81   Pulse 80   Ht $R'5\' 3"'kB$  (1.6 m)   Wt 209 lb (94.8 kg)   BMI 37.02 kg/m   Constitutional:  Alert, No acute distress. HEENT: [place] AT Respiratory: Normal respiratory effort Psychiatric: Normal mood and affect.  Laboratory Data:  Urinalysis Dipstick 1+ leukocytes Microscopy 11-30 WBC/>10 epis   Pertinent Imaging: CT images personally reviewed and interpreted  CT Renal Stone Study  Narrative CLINICAL DATA:  Right-sided back pain wrapping around to flank, history of kidney stones  EXAM: CT ABDOMEN AND PELVIS WITHOUT CONTRAST  TECHNIQUE: Multidetector CT imaging of the abdomen and pelvis was performed following the standard protocol without IV contrast.  RADIATION DOSE REDUCTION: This exam was performed according to the departmental dose-optimization program which includes automated exposure control, adjustment of the mA and/or kV according to patient size and/or use of iterative reconstruction  technique.  COMPARISON:  None Available.  FINDINGS: Lower chest: The lung bases are clear. The imaged heart is unremarkable.  Hepatobiliary: The liver and gallbladder are unremarkable. There is no biliary ductal dilatation.  Pancreas: Unremarkable.  Spleen: Unremarkable.  Adrenals/Urinary Tract: The adrenals are unremarkable.  The kidneys are unremarkable, with no focal lesion, stone, hydronephrosis, or hydroureter. No stone is seen along the course of either ureter. Multiple pelvic phleboliths are seen. The bladder is unremarkable.  Stomach/Bowel: The stomach is unremarkable. There is no evidence of bowel obstruction. There is no abnormal bowel wall thickening or inflammatory change. There is colonic diverticulosis without evidence of acute diverticulitis. The appendix is not definitively identified, but there is no pericecal inflammatory change.  Vascular/Lymphatic: The abdominal aorta is normal in course and caliber. Incidental note is  made of a retroaortic left renal vein. There is no abdominopelvic lymphadenopathy.  Reproductive: The uterus and adnexa are unremarkable.  Other: There is no ascites or free air. There is a small fat containing umbilical hernia.  Musculoskeletal: There is no acute osseous abnormality or suspicious osseous lesion.  IMPRESSION: 1. No acute findings in the abdomen or pelvis. No renal stones or hydroureteronephrosis identified. 2. Diverticulosis without evidence of acute diverticulitis. 3. Aortic Atherosclerosis (ICD10-I70.0).   Electronically Signed By: Valetta Mole M.D. On: 05/17/2021 16:05   Assessment & Plan:   66 y.o. female with history of pelvic pain and storage related voiding symptoms She has had pyuria with negative urine cultures UA today with pyuria but significant squamous epis consistent with contamination Trial Gemtesa 75 mg daily to assess for improvement in voiding symptoms/pelvic pressure Schedule  cystoscopy   Abbie Sons, MD  [facility] [address] [place], [place] [address] [phone]

## 2021-06-09 LAB — URINALYSIS, COMPLETE
Bilirubin, UA: NEGATIVE
Glucose, UA: NEGATIVE
Ketones, UA: NEGATIVE
Nitrite, UA: NEGATIVE
Protein,UA: NEGATIVE
RBC, UA: NEGATIVE
Specific Gravity, UA: 1.03 (ref 1.005–1.030)
Urobilinogen, Ur: 0.2 mg/dL (ref 0.2–1.0)
pH, UA: 5.5 (ref 5.0–7.5)

## 2021-06-09 LAB — MICROSCOPIC EXAMINATION: Epithelial Cells (non renal): >10 /hpf — AB (ref 0–10)

## 2021-06-15 ENCOUNTER — Telehealth: Payer: Self-pay | Admitting: Family Medicine

## 2021-06-15 NOTE — Telephone Encounter (Signed)
Patient was called and informed of FMLA paperwork completed. Patient stated that she would pick up the paperwork and personally take it to [facility] herself. Original was left up front for patient. A copy was scanned in patient's chart.

## 2021-06-17 ENCOUNTER — Ambulatory Visit (INDEPENDENT_AMBULATORY_CARE_PROVIDER_SITE_OTHER): Payer: Commercial Managed Care - PPO | Admitting: Urology

## 2021-06-17 ENCOUNTER — Encounter: Payer: Self-pay | Admitting: Urology

## 2021-06-17 VITALS — BP 130/80 | HR 74 | Ht 63.0 in | Wt 209.0 lb

## 2021-06-17 DIAGNOSIS — R350 Frequency of micturition: Secondary | ICD-10-CM

## 2021-06-17 DIAGNOSIS — R102 Pelvic and perineal pain: Secondary | ICD-10-CM

## 2021-06-17 DIAGNOSIS — R3915 Urgency of urination: Secondary | ICD-10-CM

## 2021-06-17 LAB — MICROSCOPIC EXAMINATION

## 2021-06-17 LAB — URINALYSIS, COMPLETE
Bilirubin, UA: NEGATIVE
Glucose, UA: NEGATIVE
Ketones, UA: NEGATIVE
Nitrite, UA: NEGATIVE
Protein,UA: NEGATIVE
Specific Gravity, UA: 1.025 (ref 1.005–1.030)
Urobilinogen, Ur: 0.2 mg/dL (ref 0.2–1.0)
pH, UA: 5.5 (ref 5.0–7.5)

## 2021-06-17 MED ORDER — AMITRIPTYLINE HCL 10 MG PO TABS: 10.0000 mg | ORAL_TABLET | Freq: Every day | ORAL | 0 refills | Status: DC

## 2021-06-17 NOTE — Progress Notes (Signed)
   06/17/21  CC:  Chief Complaint  Patient presents with   Cysto    HPI: 66 y.o. female seen 06/08/2021 with pelvic pain, bladder pressure, frequency and urgency.  Refer to my note for details.  Mild symptom improvement on Gemtesa.  UA today trace blood/trace leukocytes.  6-10 WBC microscopy  Blood pressure 130/80, pulse 74, height '5\' 3"'$  (1.6 m), weight 209 lb (94.8 kg). NED. A&Ox3.   No respiratory distress   Abd soft, NT, ND Atrophic external genitalia with patent urethral meatus  Cystoscopy Procedure Note  Patient identification was confirmed, informed consent was obtained, and patient was prepped using Betadine solution.  Lidocaine jelly was administered per urethral meatus.    Procedure: - Flexible cystoscope introduced, without any difficulty.   - Thorough search of the bladder revealed:    normal urethral meatus    normal urothelium    no stones    no ulcers     no tumors    no urethral polyps    no trabeculation  - Ureteral orifices were normal in position and appearance.  Post-Procedure: - Patient tolerated the procedure well  Assessment/ Plan: No abnormalities on cystoscopy Previous CT showed no abnormalities that could explain her pain We discussed possibility of chronic bladder pain syndrome/IC Trial low-dose amitriptyline 10 mg nightly.  Possible side effect of drowsiness was discussed PA follow-up 1 month for symptom recheck Urine culture ordered    Abbie Sons, MD

## 2021-06-20 LAB — CULTURE, URINE COMPREHENSIVE

## 2021-07-18 ENCOUNTER — Ambulatory Visit: Payer: Commercial Managed Care - PPO | Admitting: Physician Assistant

## 2021-07-18 VITALS — BP 128/76 | HR 83 | Ht 63.0 in | Wt 210.0 lb

## 2021-07-18 DIAGNOSIS — R102 Pelvic and perineal pain: Secondary | ICD-10-CM

## 2021-07-18 NOTE — Patient Instructions (Signed)
Common foods that can worsen bladder irritation (bladder pain, pain with urination, urinary urgency/frequency) include: Caffeine Alcohol Carbonated beverages Chocolate Spicy foods Acidic foods including citrus and tomatoes

## 2021-07-18 NOTE — Progress Notes (Signed)
07/18/2021 9:39 AM   Danielle Davenport [birth year] [id number]  CC: Chief Complaint  Patient presents with   Follow-up   Urinary Frequency   HPI: Danielle Davenport is a 66 y.o. 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   Today she reports she took the amitriptyline but stopped it secondary to grogginess.  She states Gemtesa had helped a little bit with her bladder pressure, however she thought that she had to stop this in order to start the amitriptyline.  Overall, she states that her urinary symptoms including RLQ discomfort have significantly improved since she was last seen in clinic.  She is not terribly bothered at this point and does not wish to resume Gemtesa or other pharmacotherapy.  PMH: Past Medical History:  Diagnosis Date   Arthritis    BRCA negative    Colon polyp    Complication of anesthesia    slow to wake   Depression    Diverticulosis    Fatty liver    Functional disorder of stomach    Gastroduodenal ulcer    GERD (gastroesophageal reflux disease)    Liver disorder    Thyroid disease    Hypothyroid    Surgical History: Past Surgical History:  Procedure Laterality Date   BREAST BIOPSY Left 01/26/2021   stereo bx-calcs/?COIL" clip-path pending   BREAST EXCISIONAL BIOPSY Left 2000   benign   BREAST SURGERY     CATARACT EXTRACTION W/PHACO Left 03/08/2021   Procedure: CATARACT EXTRACTION PHACO AND INTRAOCULAR LENS PLACEMENT ([facility]) LEFT 3.23 00:20.3;  Surgeon: Birder Robson, MD;  Location: [facility];  Service: Ophthalmology;  Laterality: Left;   CATARACT EXTRACTION W/PHACO Right 03/22/2021   Procedure: CATARACT EXTRACTION PHACO AND INTRAOCULAR LENS PLACEMENT ([place]) RIGHT;  Surgeon: Birder Robson, MD;  Location: [facility];  Service: Ophthalmology;  Laterality: Right;   2.94 0:25.2   COLONOSCOPY WITH PROPOFOL N/A 11/06/2019   Procedure: COLONOSCOPY WITH PROPOFOL;  Surgeon: Virgel Manifold, MD;  Location: [facility];  Service: Endoscopy;  Laterality: N/A;   HYSTEROSCOPY     INCONTINENCE SURGERY     TUBAL LIGATION     UTERINE FIBROID SURGERY      Home Medications:  Allergies as of 07/18/2021       Reactions   Epinephrine Other (See Comments)   Was added to "numbing" injection for breast biopsy.  Caused "shakes" for a while afterwards.   Sertraline Hcl    uncontrollable bowels, grinding and clinching teeth.        Medication List        Accurate as of July 18, 2021  9:39 AM. If you have any questions, ask your nurse or doctor.          acetaminophen 325 MG tablet Commonly known as: TYLENOL Take by mouth.   ALPRAZolam 0.25 MG tablet Commonly known as: XANAX Take 1 tablet (0.25 mg total) by mouth at bedtime as needed for anxiety.   amitriptyline 10 MG tablet Commonly known as: ELAVIL Take 1 tablet (10 mg total) by mouth at bedtime.   amLODipine 5 MG tablet Commonly known as: NORVASC Take 1 tablet (5 mg total) by mouth daily.   aspirin 81 MG chewable tablet Chew by mouth daily.   Centrum Silver Chew Chew by mouth.   Fish Oil 1000 MG Caps Take by mouth.   Gemtesa 75 MG Tabs Generic drug:  Vibegron Take 75 mg by mouth daily.   ibuprofen 800 MG tablet Commonly known as: ADVIL Take by mouth.   levothyroxine 75 MCG tablet Commonly known as: SYNTHROID TAKE 1 TABLET BY MOUTH ONCE A DAY   vitamin E 180 MG (400 UNITS) capsule Take 400 Units by mouth daily.   ZINC PO Take 1 tablet by mouth.        Allergies:  Allergies  Allergen Reactions   Epinephrine Other (See Comments)    Was added to "numbing" injection for breast biopsy.  Caused "shakes" for a while afterwards.   Sertraline Hcl     uncontrollable bowels, grinding and clinching teeth.    Family History: Family History  Problem Relation Age of  Onset   Breast cancer Mother 41   Brain cancer Mother    Cirrhosis Father    COPD Brother    COPD Daughter    Emphysema Maternal Grandfather    Stroke Paternal Grandfather    Brain cancer Sister    Breast cancer Maternal Aunt    Stroke Maternal Uncle     Social History:   reports that she has never smoked. She has never used smokeless tobacco. She reports that she does not drink alcohol and does not use drugs.  Physical Exam: BP 128/76   Pulse 83   Ht 5' 3"  (1.6 m)   Wt 210 lb (95.3 kg)   BMI 37.20 kg/m   Constitutional:  Alert and oriented, no acute distress, nontoxic appearing HEENT: [place], AT Cardiovascular: No clubbing, cyanosis, or edema Respiratory: Normal respiratory effort, no increased work of breathing Skin: No rashes, bruises or suspicious lesions Neurologic: Grossly intact, no focal deficits, moving all 4 extremities Psychiatric: Normal mood and affect  Assessment & Plan:   1. Pelvic pressure in female Did not tolerate amitriptyline.  Off Gemtesa with significant improvement in urinary symptoms.  She does not wish to resume pharmacotherapy, which is reasonable.  We discussed that if this does not represent IC/chronic bladder pain syndrome, this typically is a condition characterized by relapsing bladder symptoms.  May consider restarting Gemtesa or a trial of bladder rescue treatments in the future if her symptoms resume.  She is in agreement with this plan.  We discussed bladder irritants today and I gave her a list in her AVS.  Return if symptoms worsen or fail to improve.  Debroah Loop, PA-C  [facility] [address] [place], [place] [address] [phone]

## 2021-07-25 NOTE — Progress Notes (Unsigned)
Argentina Ponder De[place],acting as a scribe for Wilhemena Durie, MD.,have documented all relevant documentation on the behalf of Wilhemena Durie, MD,as directed by  Wilhemena Durie, MD while in the presence of Wilhemena Durie, MD.     Established patient visit   Patient: Danielle Davenport   DOB: 05-04-1955   66 y.o. Female  MRN: [id number] Visit Date: 07/26/2021  Today's healthcare provider: Wilhemena Durie, MD   No chief complaint on file.  Subjective    HPI  Patient comes in today for follow-up.  She is feeling pretty well.  Home blood pressure readings are good. She is tolerating her medications well. Hypertension, follow-up  BP Readings from Last 3 Encounters:  07/26/21 131/70  07/18/21 128/76  06/17/21 130/80   Wt Readings from Last 3 Encounters:  07/26/21 211 lb (95.7 kg)  07/18/21 210 lb (95.3 kg)  06/17/21 209 lb (94.8 kg)     She was last seen for hypertension 3 months ago.  Management since that visit includes; Started amlodipine 5 mg.    Outside blood pressures are 130's-160's.  Pertinent labs Lab Results  Component Value Date   CHOL 209 (H) 10/04/2020   HDL 54 10/04/2020   LDLCALC 133 (H) 10/04/2020   TRIG 125 10/04/2020   CHOLHDL 3.9 10/04/2020   Lab Results  Component Value Date   NA 142 05/19/2021   K 4.2 05/19/2021   CREATININE 0.66 05/19/2021   EGFR 97 05/19/2021   GLUCOSE 90 05/19/2021   TSH 1.570 10/04/2020     The 10-year ASCVD risk score (Arnett DK, et al., 2019) is: 8.9%  ---------------------------------------------------------------------------------------------------   Medications: Outpatient Medications Prior to Visit  Medication Sig   acetaminophen (TYLENOL) 325 MG tablet Take by mouth.   ALPRAZolam (XANAX) 0.25 MG tablet Take 1 tablet (0.25 mg total) by mouth at bedtime as needed for anxiety.   amLODipine (NORVASC) 5 MG tablet Take 1 tablet (5 mg total) by mouth daily.   aspirin 81 MG chewable tablet Chew by  mouth daily.   ibuprofen (ADVIL,MOTRIN) 800 MG tablet Take by mouth.   levothyroxine (SYNTHROID) 75 MCG tablet TAKE 1 TABLET BY MOUTH ONCE A DAY   Multiple Vitamins-Minerals (CENTRUM SILVER) CHEW Chew by mouth.   Multiple Vitamins-Minerals (ZINC PO) Take 1 tablet by mouth.   Omega-3 Fatty Acids (FISH OIL) 1000 MG CAPS Take by mouth.   vitamin E 180 MG (400 UNITS) capsule Take 400 Units by mouth daily.   amitriptyline (ELAVIL) 10 MG tablet Take 1 tablet (10 mg total) by mouth at bedtime.   Vibegron (GEMTESA) 75 MG TABS Take 75 mg by mouth daily.   No facility-administered medications prior to visit.    Review of Systems  Respiratory:  Negative for shortness of breath and wheezing.   Cardiovascular:  Negative for chest pain, palpitations and leg swelling.  Gastrointestinal:  Negative for nausea and vomiting.  Neurological:  Negative for dizziness and headaches.    Last hemoglobin A1c No results found for: "HGBA1C"     Objective    BP 131/70 (BP Location: Left Arm, Patient Position: Sitting, Cuff Size: Large)   Pulse 73   Temp 97.9 F (36.6 C) (Oral)   Wt 211 lb (95.7 kg)   SpO2 98%   BMI 37.38 kg/m  BP Readings from Last 3 Encounters:  07/26/21 131/70  07/18/21 128/76  06/17/21 130/80   Wt Readings from Last 3 Encounters:  07/26/21 211 lb (95.7 kg)  07/18/21 210  lb (95.3 kg)  06/17/21 209 lb (94.8 kg)      Physical Exam Vitals reviewed.  Constitutional:      General: She is not in acute distress.    Appearance: She is well-developed.  HENT:     Head: Normocephalic and atraumatic.     Right Ear: Hearing normal.     Left Ear: Hearing normal.     Nose: Nose normal.  Eyes:     General: Lids are normal. No scleral icterus.       Right eye: No discharge.        Left eye: No discharge.     Conjunctiva/sclera: Conjunctivae normal.  Cardiovascular:     Rate and Rhythm: Normal rate and regular rhythm.     Heart sounds: Normal heart sounds.  Pulmonary:     Effort:  Pulmonary effort is normal. No respiratory distress.  Skin:    Findings: No lesion or rash.  Neurological:     General: No focal deficit present.     Mental Status: She is alert and oriented to person, place, and time.  Psychiatric:        Mood and Affect: Mood normal.        Speech: Speech normal.        Behavior: Behavior normal.        Thought Content: Thought content normal.        Judgment: Judgment normal.       No results found for any visits on 07/26/21.  Assessment & Plan     1. Hypertension, unspecified type Excellent blood pressure control with amlodipine 5 mg daily  2. Urinary tract infection without hematuria, site unspecified Follow-up urine which is negative. - POCT urinalysis dipstick  3. Class 2 obesity due to excess calories without serious comorbidity with body mass index (BMI) of 37.0 to 37.9 in adult Diet and exercise with lifestyle changes discussed  4. Hypothyroidism, unspecified type For euthyroid TSH  5. Chronic venous insufficiency    No follow-ups on file.      I, Wilhemena Durie, MD, have reviewed all documentation for this visit. The documentation on 07/27/21 for the exam, diagnosis, procedures, and orders are all accurate and complete.    Jashua Knaak Cranford Mon, MD  [facility] [phone] (phone) [phone] (fax)  [place]

## 2021-07-26 ENCOUNTER — Ambulatory Visit (INDEPENDENT_AMBULATORY_CARE_PROVIDER_SITE_OTHER): Payer: Commercial Managed Care - PPO | Admitting: Family Medicine

## 2021-07-26 VITALS — BP 131/70 | HR 73 | Temp 97.9°F | Wt 211.0 lb

## 2021-07-26 DIAGNOSIS — I10 Essential (primary) hypertension: Secondary | ICD-10-CM | POA: Diagnosis not present

## 2021-07-26 DIAGNOSIS — I872 Venous insufficiency (chronic) (peripheral): Secondary | ICD-10-CM

## 2021-07-26 DIAGNOSIS — E66812 Obesity, class 2: Secondary | ICD-10-CM

## 2021-07-26 DIAGNOSIS — Z6837 Body mass index (BMI) 37.0-37.9, adult: Secondary | ICD-10-CM

## 2021-07-26 DIAGNOSIS — E6609 Other obesity due to excess calories: Secondary | ICD-10-CM

## 2021-07-26 DIAGNOSIS — E039 Hypothyroidism, unspecified: Secondary | ICD-10-CM | POA: Diagnosis not present

## 2021-07-26 DIAGNOSIS — N390 Urinary tract infection, site not specified: Secondary | ICD-10-CM

## 2021-07-26 LAB — POCT URINALYSIS DIPSTICK
Bilirubin, UA: NEGATIVE
Blood, UA: NEGATIVE
Glucose, UA: NEGATIVE
Ketones, UA: NEGATIVE
Leukocytes, UA: NEGATIVE
Nitrite, UA: NEGATIVE
Protein, UA: NEGATIVE
Spec Grav, UA: 1.015 (ref 1.010–1.025)
Urobilinogen, UA: 0.2 E.U./dL
pH, UA: 6 (ref 5.0–8.0)

## 2021-08-31 ENCOUNTER — Other Ambulatory Visit: Payer: Self-pay | Admitting: Family Medicine

## 2021-09-05 IMAGING — MG DIGITAL SCREENING BILAT W/ TOMO W/ CAD
6 of 10 series · 6 of 30 positions shown · non-contrast
Comparison: Previous exam(s).

CLINICAL DATA: Screening.

EXAM:
DIGITAL SCREENING BILATERAL MAMMOGRAM WITH TOMO AND CAD

[R CC synth-2D]
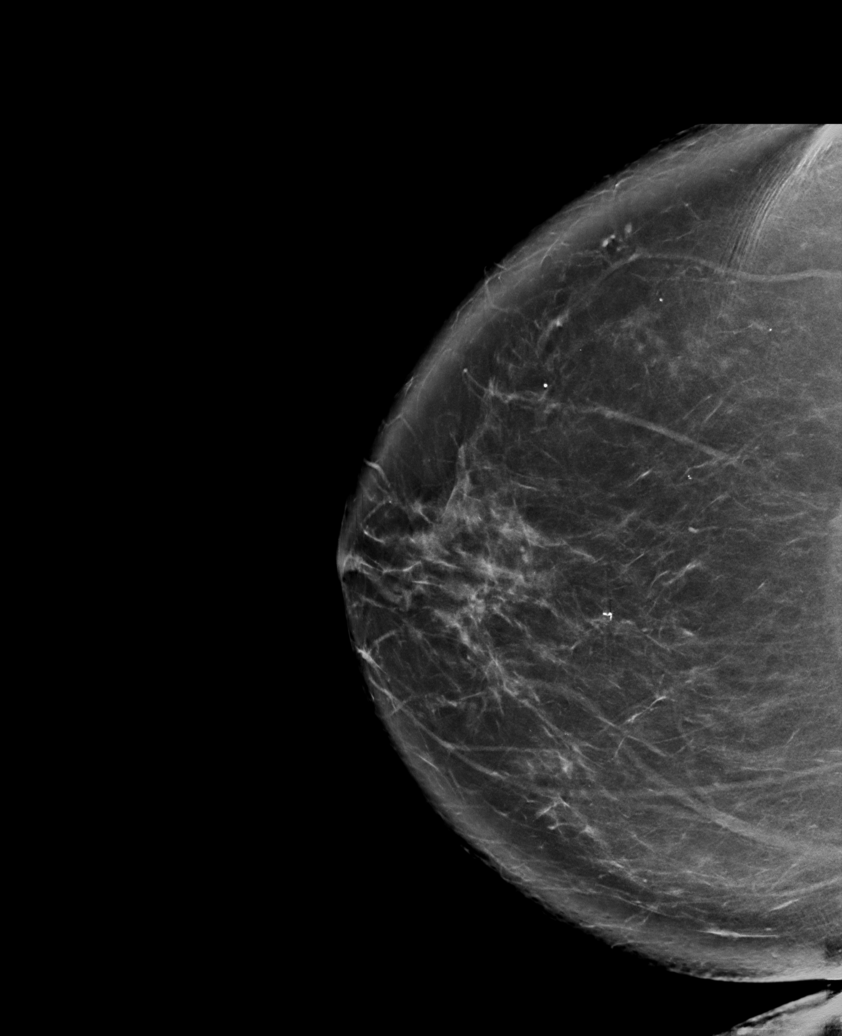

[L MLO synth-2D (1 of 2)]
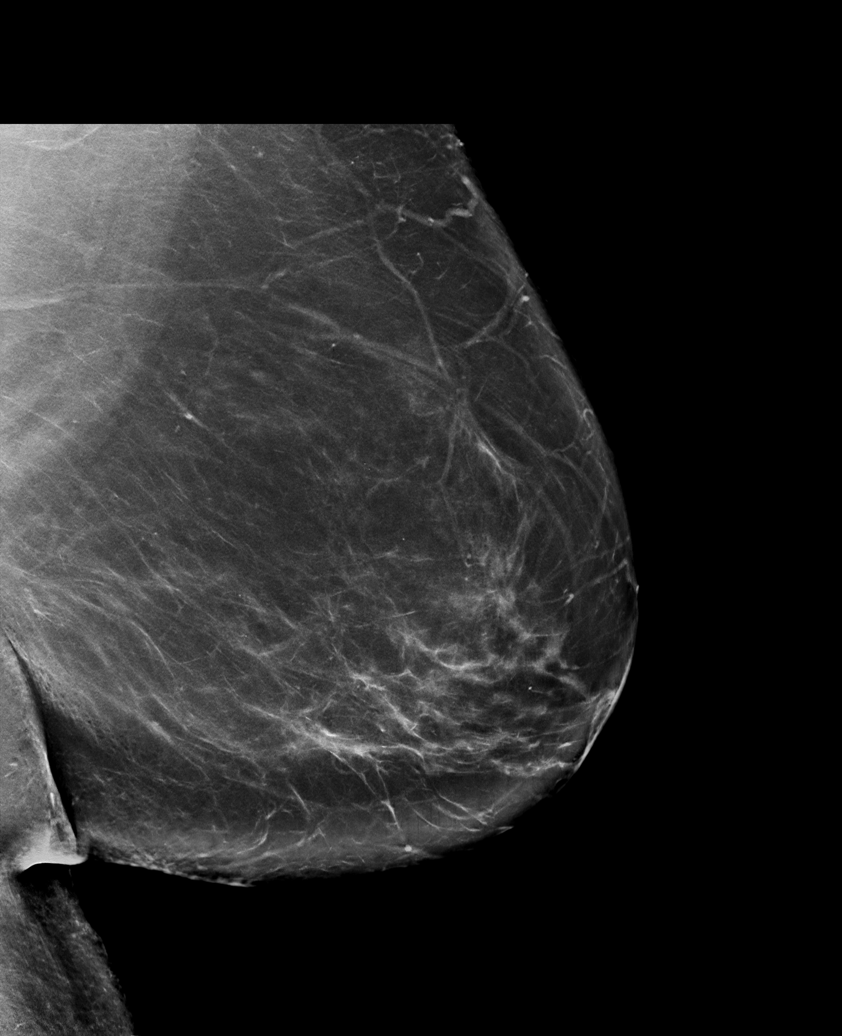

[L CC synth-2D]
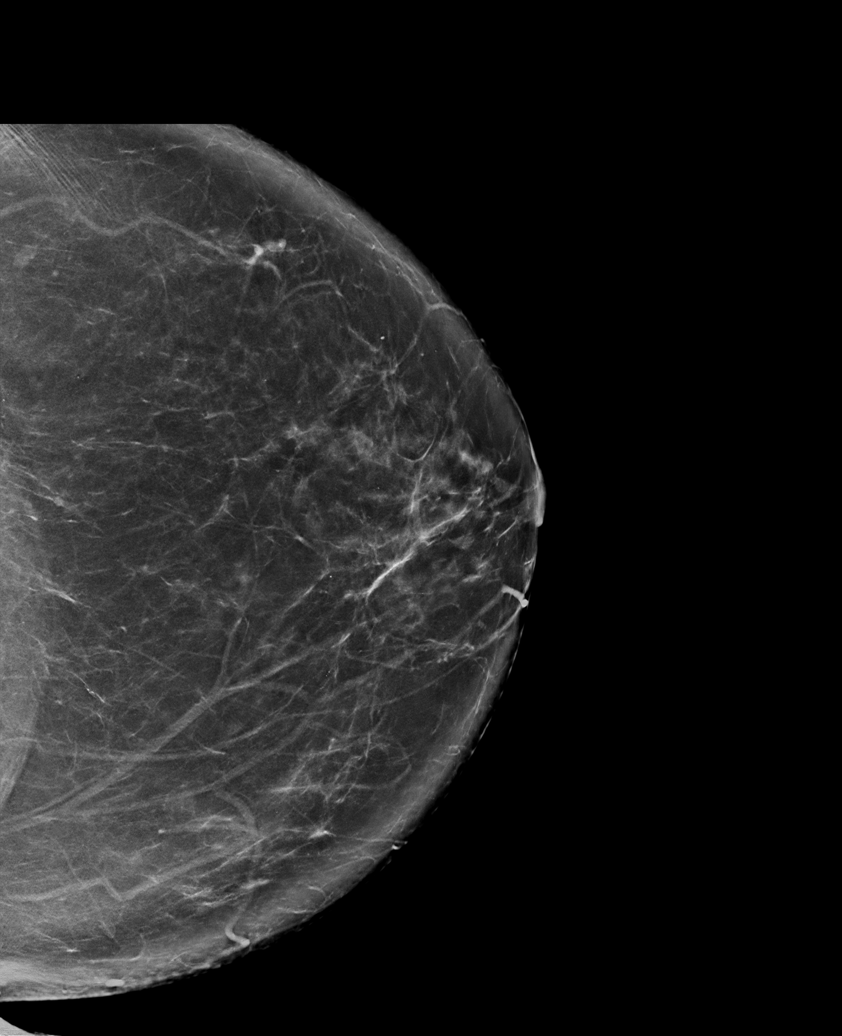

[R MLO synth-2D]
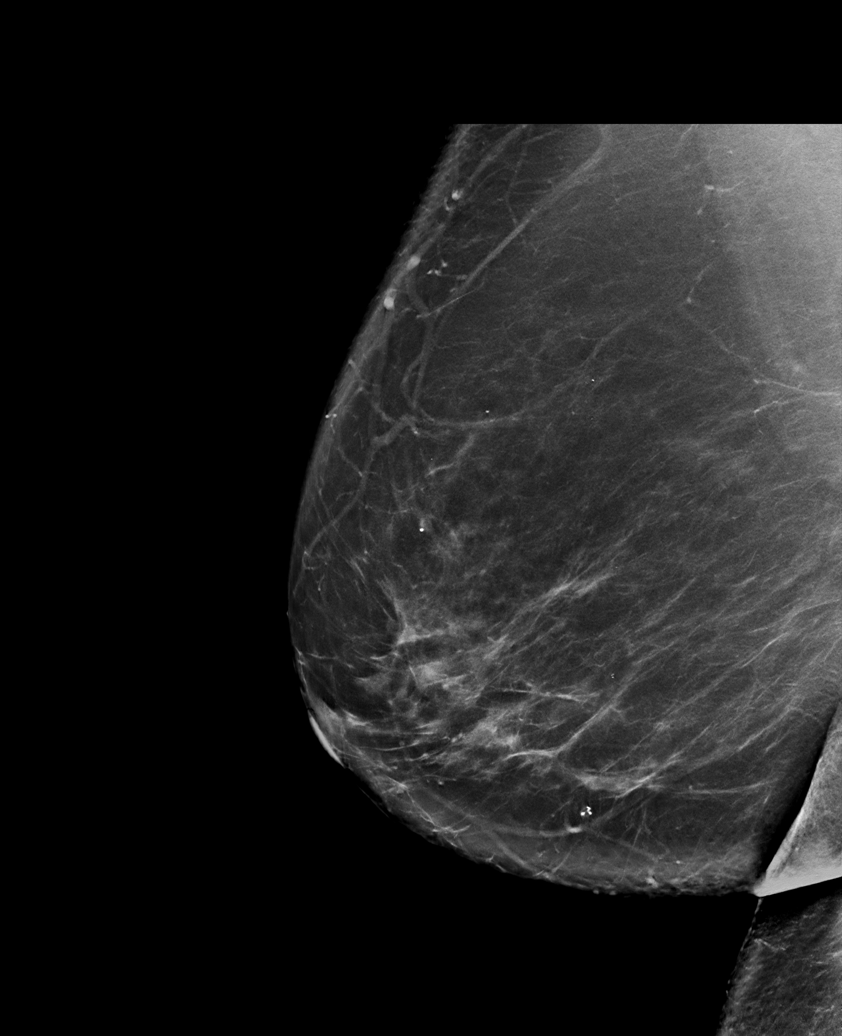

[L MLO synth-2D (2 of 2)]
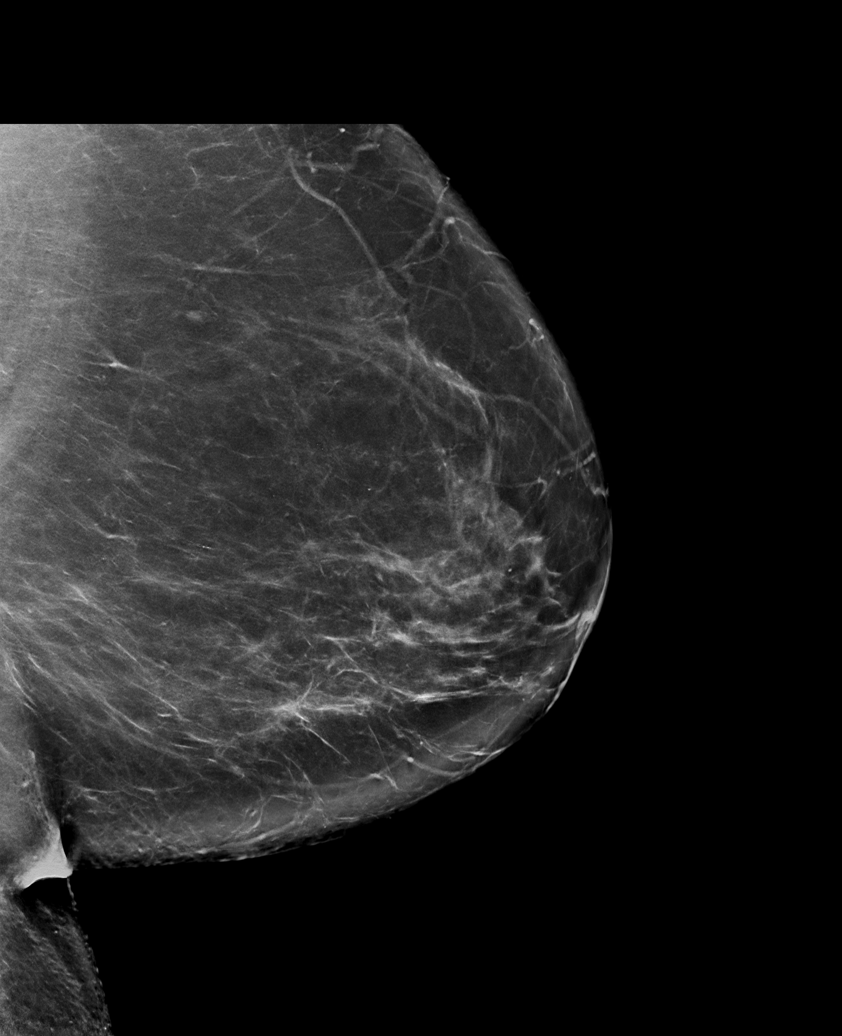

[L CC tomo · tomo slice 47/93.0]
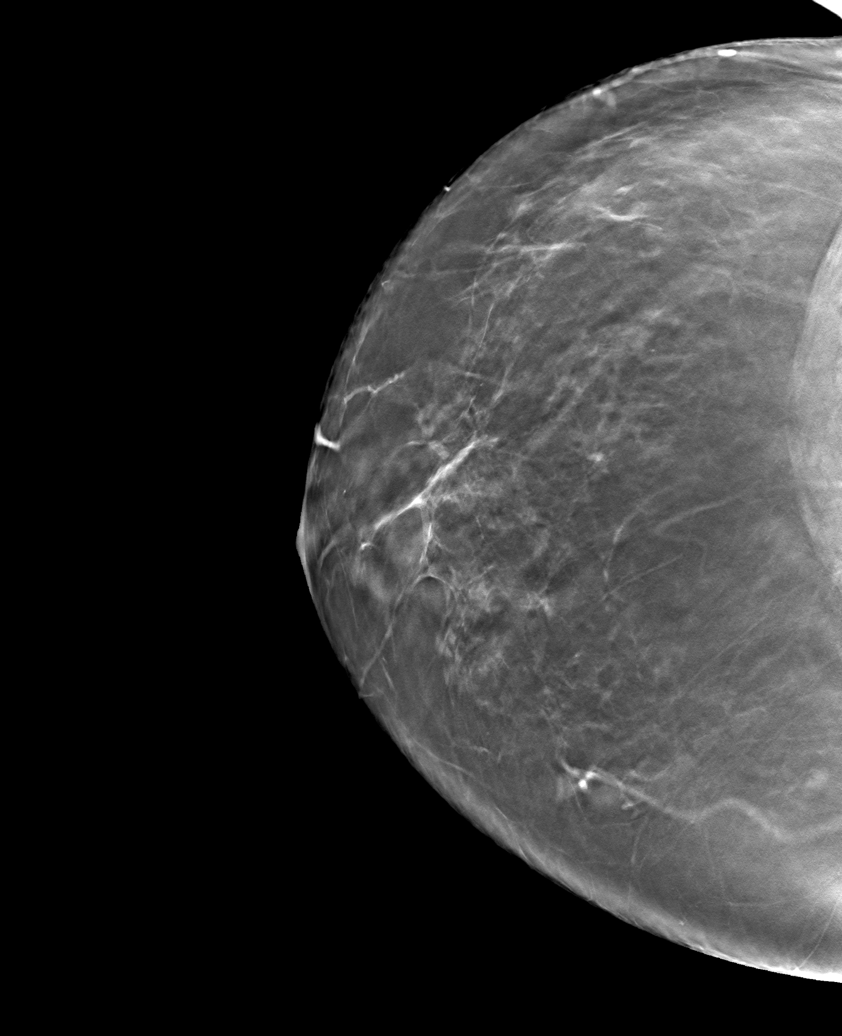

[6 of 30 positions shown; findings below may reference images not displayed]

ACR Breast Density Category b: There are scattered areas of
fibroglandular density.
FINDINGS: There are no findings suspicious for malignancy. Images were
processed with CAD.
IMPRESSION: No mammographic evidence of malignancy. A result letter of this
screening mammogram will be mailed directly to the patient.

RECOMMENDATION:
Screening mammogram in one year. (Code:[id number])

BI-RADS CATEGORY  1: Negative.

## 2021-09-28 ENCOUNTER — Telehealth: Payer: Self-pay

## 2021-09-28 ENCOUNTER — Other Ambulatory Visit: Payer: Self-pay | Admitting: Family Medicine

## 2021-09-28 DIAGNOSIS — R928 Other abnormal and inconclusive findings on diagnostic imaging of breast: Secondary | ICD-10-CM

## 2021-09-28 NOTE — Telephone Encounter (Unsigned)
Copied from [place] [id number]. Topic: General - Other >> Sep 28, 2021 12:57 PM Danielle Davenport wrote: Reason for CRM: The patient has been directed by [facility] to call their PCP and follow up on a previous request for additional orders for mammography   The patient would like for these procedures to be coordinated during the month of September if possible  Please contact further

## 2021-09-29 NOTE — Telephone Encounter (Signed)
Please call [facility] for clarification on what follow up orders are needed. Reviewed Jan 2023 mammography report for suspicious results, did not see further recommendations for follow up imaging.   [name], MD

## 2021-09-29 NOTE — Addendum Note (Signed)
Addended by: Shawna Orleans on: 09/29/2021 03:09 PM   Modules accepted: Orders

## 2021-10-04 NOTE — Progress Notes (Unsigned)
Complete physical exam  I,Danielle Davenport,acting as a scribe for [employer], MD.,have documented all relevant documentation on the behalf of Danielle Foster, MD,as directed by  Danielle Foster, MD while in the presence of Danielle Foster, MD.   Patient: Danielle Davenport   DOB: June 10, 1955   66 y.o. Female  MRN: [id number] Visit Date: 10/05/2021  Today's healthcare provider: [name], MD   Chief Complaint  Patient presents with   Annual Exam   Subjective    Danielle Davenport is a 66 y.o. female who presents today for a complete physical exam.  She reports consuming a general diet. The patient has a physically strenuous job, but has no regular exercise apart from work.  She generally feels fairly well. She reports sleeping fairly well. She does not have additional problems to discuss today.    Past Medical History:  Diagnosis Date   Arthritis    BRCA negative    Colon polyp    Complication of anesthesia    slow to wake   Depression    Diverticulosis    Fatty liver    Functional disorder of stomach    Gastroduodenal ulcer    GERD (gastroesophageal reflux disease)    Liver disorder    Thyroid disease    Hypothyroid   Past Surgical History:  Procedure Laterality Date   BREAST BIOPSY Left 01/26/2021   stereo bx-calcs/?COIL" clip-path pending   BREAST EXCISIONAL BIOPSY Left 2000   benign   BREAST SURGERY     CATARACT EXTRACTION W/PHACO Left 03/08/2021   Procedure: CATARACT EXTRACTION PHACO AND INTRAOCULAR LENS PLACEMENT ([facility]) LEFT 3.23 00:20.3;  Surgeon: Birder Robson, MD;  Location: [place];  Service: Ophthalmology;  Laterality: Left;   CATARACT EXTRACTION W/PHACO Right 03/22/2021   Procedure: CATARACT EXTRACTION PHACO AND INTRAOCULAR LENS PLACEMENT ([place]) RIGHT;  Surgeon: Birder Robson, MD;  Location: [employer];  Service: Ophthalmology;  Laterality: Right;  2.94 0:25.2    COLONOSCOPY WITH PROPOFOL N/A 11/06/2019   Procedure: COLONOSCOPY WITH PROPOFOL;  Surgeon: Virgel Manifold, MD;  Location: [facility];  Service: Endoscopy;  Laterality: N/A;   HYSTEROSCOPY     INCONTINENCE SURGERY     TUBAL LIGATION     UTERINE FIBROID SURGERY     Social History   Socioeconomic History   Marital status: Married    Spouse name: Jori Moll   Number of children: 2   Years of education: HS   Highest education level: Not on file  Occupational History   Occupation:       WORK    Employer: [employer]  Tobacco Use   Smoking status: Never   Smokeless tobacco: Never  Vaping Use   Vaping Use: Never used  Substance and Sexual Activity   Alcohol use: No    Alcohol/week: 0.0 standard drinks of alcohol   Drug use: No   Sexual activity: Yes    Birth control/protection: Post-menopausal  Other Topics Concern   Not on file  Social History Narrative   Not on file   Social Determinants of Health   Financial Resource Strain: Not on file  Food Insecurity: Not on file  Transportation Needs: Not on file  Physical Activity: Not on file  Stress: Not on file  Social Connections: Not on file  Intimate Partner Violence: Not on file   Family Status  Relation Name Status   Mother  Deceased   Father  Deceased   Brother  Alive  Daughter  Alive   Son  Alive   MGF  (Not Specified)   PGF  (Not Specified)   Sister  (Not Specified)   Mat Aunt  (Not Specified)   Mat Uncle  (Not Specified)   Family History  Problem Relation Age of Onset   Breast cancer Mother 51   Brain cancer Mother    Cirrhosis Father    COPD Brother    COPD Daughter    Emphysema Maternal Grandfather    Stroke Paternal Grandfather    Brain cancer Sister    Breast cancer Maternal Aunt    Stroke Maternal Uncle    Allergies  Allergen Reactions   Epinephrine Other (See Comments)    Was added to "numbing" injection for breast biopsy.  Caused "shakes" for a while afterwards.   Sertraline Hcl      uncontrollable bowels, grinding and clinching teeth.    Patient Care Team: Danielle Foster, MD as PCP - General (Family Medicine) Birder Robson, MD as Referring Physician (Ophthalmology)   Medications: Outpatient Medications Prior to Visit  Medication Sig   acetaminophen (TYLENOL) 325 MG tablet Take by mouth.   ALPRAZolam (XANAX) 0.25 MG tablet Take 1 tablet (0.25 mg total) by mouth at bedtime as needed for anxiety.   amLODipine (NORVASC) 5 MG tablet Take 1 tablet (5 mg total) by mouth daily.   aspirin 81 MG chewable tablet Chew by mouth daily.   ibuprofen (ADVIL,MOTRIN) 800 MG tablet Take by mouth.   levothyroxine (SYNTHROID) 75 MCG tablet TAKE 1 TABLET BY MOUTH ONCE A DAY   Multiple Vitamins-Minerals (CENTRUM SILVER) CHEW Chew by mouth.   Multiple Vitamins-Minerals (ZINC PO) Take 1 tablet by mouth.   Omega-3 Fatty Acids (FISH OIL) 1000 MG CAPS Take by mouth.   vitamin E 180 MG (400 UNITS) capsule Take 400 Units by mouth daily.   [DISCONTINUED] amitriptyline (ELAVIL) 10 MG tablet Take 1 tablet (10 mg total) by mouth at bedtime.   [DISCONTINUED] Vibegron (GEMTESA) 75 MG TABS Take 75 mg by mouth daily.   No facility-administered medications prior to visit.    Review of Systems  Constitutional:  Positive for fatigue.  All other systems reviewed and are negative.     Objective     BP 132/83 (BP Location: Left Arm, Patient Position: Sitting, Cuff Size: Large)   Pulse 67   Temp 97.8 F (36.6 C) (Oral)   Resp 16   Ht 5' 3.5" (1.613 m)   Wt 212 lb (96.2 kg)   BMI 36.97 kg/m     Physical Exam Vitals reviewed.  Constitutional:      General: She is not in acute distress.    Appearance: Normal appearance. She is not ill-appearing, toxic-appearing or diaphoretic.  HENT:     Head: Normocephalic and atraumatic.     Right Ear: Tympanic membrane and external ear normal. There is no impacted cerumen.     Left Ear: Tympanic membrane and external ear normal.  There is no impacted cerumen.     Nose: Nose normal.     Mouth/Throat:     Pharynx: Oropharynx is clear.  Eyes:     General: No scleral icterus.    Extraocular Movements: Extraocular movements intact.     Conjunctiva/sclera: Conjunctivae normal.     Pupils: Pupils are equal, round, and reactive to light.  Cardiovascular:     Rate and Rhythm: Normal rate and regular rhythm.     Pulses: Normal pulses.     Heart sounds: Normal  heart sounds. No murmur heard.    No friction rub. No gallop.  Pulmonary:     Effort: Pulmonary effort is normal. No respiratory distress.     Breath sounds: Normal breath sounds. No wheezing, rhonchi or rales.  Abdominal:     General: Bowel sounds are normal. There is no distension.     Palpations: Abdomen is soft. There is no mass.     Tenderness: There is no abdominal tenderness. There is no guarding.  Musculoskeletal:        General: No deformity.     Cervical back: Normal range of motion and neck supple. No rigidity.     Right lower leg: Edema present.     Left lower leg: Edema present.     Comments: Trace bilateral LE edema   Lymphadenopathy:     Cervical: No cervical adenopathy.  Skin:    General: Skin is warm.     Capillary Refill: Capillary refill takes less than 2 seconds.     Findings: No erythema or rash.  Neurological:     General: No focal deficit present.     Mental Status: She is alert and oriented to person, place, and time.     Motor: No weakness.     Gait: Gait normal.  Psychiatric:        Mood and Affect: Mood normal.        Behavior: Behavior normal.      Last depression screening scores    10/05/2021    8:21 AM 04/18/2021    1:24 PM 10/04/2020    9:07 AM  PHQ 2/9 Scores  PHQ - 2 Score 1 2 0  PHQ- 9 Score 2 3    Last fall risk screening    10/05/2021    8:21 AM  [place] in the past year? 0  Number falls in past yr: 0  Injury with Fall? 0  Risk for fall due to : No Fall Risks   Last Audit-C alcohol use  screening    10/05/2021    8:22 AM  Alcohol Use Disorder Test (AUDIT)  1. How often do you have a drink containing alcohol? 1  2. How many drinks containing alcohol do you have on a typical day when you are drinking? 0  3. How often do you have six or more drinks on one occasion? 0  AUDIT-C Score 1   A score of 3 or more in women, and 4 or more in men indicates increased risk for alcohol abuse, EXCEPT if all of the points are from question 1   No results found for any visits on 10/05/21.  Assessment & Plan    Routine Health Maintenance and Physical Exam  Exercise Activities and Dietary recommendations  Goals   None     Immunization History  Administered Date(s) Administered   Moderna Sars-Covid-2 Vaccination 02/11/2019, 03/11/2019, 11/26/2019   PNEUMOCOCCAL CONJUGATE-20 10/04/2020   Tdap 12/09/2013    Health Maintenance  Topic Date Due   Zoster Vaccines- Shingrix (1 of 2) Never done   COVID-19 Vaccine (4 - Moderna series) 01/21/2020   DEXA SCAN  Never done   OPHTHALMOLOGY EXAM  07/19/2021   INFLUENZA VACCINE  04/09/2022 (Originally 08/09/2021)   MAMMOGRAM  01/08/2023   TETANUS/TDAP  12/10/2023   COLONOSCOPY (Pts 45-28yr Insurance coverage will need to be confirmed)  11/05/2029   Pneumonia Vaccine 66 Years old  Completed   Hepatitis C Screening  Completed   HPV VACCINES  Aged Out    Discussed health benefits of physical activity, and encouraged her to engage in regular exercise appropriate for her age and condition.  Problem List Items Addressed This Visit       Cardiovascular and Mediastinum   PAD (peripheral artery disease) ([place])    Chronic, stable  She will continue ASA 61m daily  No changes to medications today       Hypertension    Chronic, stable  BP within goal range today  She will continue amlodipine 572mdaily  CMP collected today         Endocrine   Hypothyroidism    Chronic,stable  She will continue levothyroxine 7556mdaily  Will check  TSH and free t4 today and adjust medication as necessary       Relevant Orders   TSH + free T4     Other   Post-menopausal    DEXA scan ordered  Discussed reason for this recommendation, patient voiced understanding       Relevant Orders   DG Bone Density   BMI 36.0-36.9,adult    Discussed recommendations for diet management and increased physical activity  Discussed recommendations for portion control to help with cravings for sweets and stress eating  Encouraged walking extra 15-20 mins in addition to walking at work  Will collect CMP, lipid panel and A1c today       Relevant Orders   Comprehensive metabolic panel   Lipid panel   Hemoglobin A1c   Annual physical exam - Primary    Presented for annual physical today  Recommended Shingrix vaccine and covid vaccination  Collected lipid panel, A1c, TSH/free T4 Ordered DEXA scan  Follow up in 6 months for HTN        Relevant Orders   DG Bone Density   Other Visit Diagnoses     Encounter for osteoporosis screening in asymptomatic postmenopausal patient       Relevant Orders   DG Bone Density        Return in about 6 months (around 04/05/2022) for HTN .     I, MakEulis FosterD, have reviewed all documentation for this visit. The documentation on 10/05/21 for the exam, diagnosis, procedures, and orders are all accurate and complete.    MakEulis FosterD  [phone]) [phone])  [facility]

## 2021-10-05 ENCOUNTER — Encounter: Payer: Commercial Managed Care - PPO | Admitting: Family Medicine

## 2021-10-05 ENCOUNTER — Telehealth: Payer: Self-pay | Admitting: Family Medicine

## 2021-10-05 ENCOUNTER — Ambulatory Visit (INDEPENDENT_AMBULATORY_CARE_PROVIDER_SITE_OTHER): Payer: Commercial Managed Care - PPO | Admitting: Family Medicine

## 2021-10-05 ENCOUNTER — Encounter: Payer: Self-pay | Admitting: Family Medicine

## 2021-10-05 VITALS — BP 132/83 | HR 67 | Temp 97.8°F | Resp 16 | Ht 63.5 in | Wt 212.0 lb

## 2021-10-05 DIAGNOSIS — Z13820 Encounter for screening for osteoporosis: Secondary | ICD-10-CM | POA: Diagnosis not present

## 2021-10-05 DIAGNOSIS — Z6836 Body mass index (BMI) 36.0-36.9, adult: Secondary | ICD-10-CM | POA: Diagnosis not present

## 2021-10-05 DIAGNOSIS — I10 Essential (primary) hypertension: Secondary | ICD-10-CM | POA: Insufficient documentation

## 2021-10-05 DIAGNOSIS — Z0000 Encounter for general adult medical examination without abnormal findings: Secondary | ICD-10-CM | POA: Insufficient documentation

## 2021-10-05 DIAGNOSIS — E039 Hypothyroidism, unspecified: Secondary | ICD-10-CM

## 2021-10-05 DIAGNOSIS — Z780 Asymptomatic menopausal state: Secondary | ICD-10-CM | POA: Diagnosis not present

## 2021-10-05 DIAGNOSIS — I739 Peripheral vascular disease, unspecified: Secondary | ICD-10-CM

## 2021-10-05 NOTE — Assessment & Plan Note (Signed)
DEXA scan ordered  Discussed reason for this recommendation, patient voiced understanding

## 2021-10-05 NOTE — Assessment & Plan Note (Signed)
Chronic, stable  BP within goal range today  She will continue amlodipine '5mg'$  daily  CMP collected today

## 2021-10-05 NOTE — Assessment & Plan Note (Addendum)
Presented for annual physical today  Recommended Shingrix vaccine and covid vaccination  Collected lipid panel, A1c, TSH/free T4 Ordered DEXA scan  Follow up in 6 months for HTN  Due for ophthalmology exam: Patient goes to [facility] recommended for updated eye exam, she has had cataracts surgery in Feb-Mar of this year

## 2021-10-05 NOTE — Telephone Encounter (Signed)
Pt states that she forgot to bring her physical form for work with her today.  She will have it faxed to [place] and she would like it faxed back to them and would also like to pick it up when ready.  She states that it has to be in by Friday 10/07/2021.  Please call after it has been faxed and ready for p/u.

## 2021-10-05 NOTE — Telephone Encounter (Signed)
Form is completed and faxed.  Pt was notified that it is ready for pick up.

## 2021-10-05 NOTE — Assessment & Plan Note (Signed)
Chronic,stable  She will continue levothyroxine 78mg daily  Will check TSH and free t4 today and adjust medication as necessary

## 2021-10-05 NOTE — Assessment & Plan Note (Signed)
Chronic, stable  She will continue ASA '81mg'$  daily  No changes to medications today

## 2021-10-05 NOTE — Assessment & Plan Note (Addendum)
Discussed recommendations for diet management and increased physical activity  Discussed recommendations for portion control to help with cravings for sweets and stress eating  Encouraged walking extra 15-20 mins in addition to walking at work  Will collect CMP, lipid panel and A1c today

## 2021-10-06 LAB — COMPREHENSIVE METABOLIC PANEL
ALT: 22 IU/L (ref 0–32)
AST: 19 IU/L (ref 0–40)
Albumin/Globulin Ratio: 2.2 (ref 1.2–2.2)
Albumin: 4.8 g/dL (ref 3.9–4.9)
Alkaline Phosphatase: 131 IU/L — ABNORMAL HIGH (ref 44–121)
BUN/Creatinine Ratio: 17 (ref 12–28)
BUN: 14 mg/dL (ref 8–27)
Bilirubin Total: 0.3 mg/dL (ref 0.0–1.2)
CO2: 22 mmol/L (ref 20–29)
Calcium: 9.5 mg/dL (ref 8.7–10.3)
Chloride: 106 mmol/L (ref 96–106)
Creatinine, Ser: 0.81 mg/dL (ref 0.57–1.00)
Globulin, Total: 2.2 g/dL (ref 1.5–4.5)
Glucose: 91 mg/dL (ref 70–99)
Potassium: 4.4 mmol/L (ref 3.5–5.2)
Sodium: 144 mmol/L (ref 134–144)
Total Protein: 7 g/dL (ref 6.0–8.5)
eGFR: 80 mL/min/{1.73_m2} (ref >59)

## 2021-10-06 LAB — LIPID PANEL
Chol/HDL Ratio: 3.7 ratio (ref 0.0–4.4)
Cholesterol, Total: 204 mg/dL — ABNORMAL HIGH (ref 100–199)
HDL: 55 mg/dL (ref >39)
LDL Chol Calc (NIH): 129 mg/dL — ABNORMAL HIGH (ref 0–99)
Triglycerides: 111 mg/dL (ref 0–149)
VLDL Cholesterol Cal: 20 mg/dL (ref 5–40)

## 2021-10-06 LAB — TSH+FREE T4
Free T4: 1.24 ng/dL (ref 0.82–1.77)
TSH: 1.8 u[IU]/mL (ref 0.450–4.500)

## 2021-10-06 LAB — HEMOGLOBIN A1C
Est. average glucose Bld gHb Est-mCnc: 117 mg/dL
Hgb A1c MFr Bld: 5.7 % — ABNORMAL HIGH (ref 4.8–5.6)

## 2021-10-13 ENCOUNTER — Other Ambulatory Visit: Payer: Self-pay | Admitting: Family Medicine

## 2021-10-13 DIAGNOSIS — I10 Essential (primary) hypertension: Secondary | ICD-10-CM

## 2021-10-13 NOTE — Telephone Encounter (Signed)
Per OV 10/05/21- continue Rx Requested Prescriptions  Pending Prescriptions Disp Refills  . amLODipine (NORVASC) 5 MG tablet [Pharmacy Med Name: AMLODIPINE BESYLATE 5 MG TAB] 90 tablet 1    Sig: TAKE 1 TABLET BY MOUTH ONCE A DAY     Cardiovascular: Calcium Channel Blockers 2 Passed - 10/13/2021  8:50 AM      Passed - Last BP in normal range    BP Readings from Last 1 Encounters:  10/05/21 132/83         Passed - Last Heart Rate in normal range    Pulse Readings from Last 1 Encounters:  10/05/21 67         Passed - Valid encounter within last 6 months    Recent Outpatient Visits          1 week ago Annual physical exam   [place], [place], MD   2 months ago Hypertension, unspecified type   [facility] Jerrol Banana., MD   4 months ago Suprapubic abdominal pain   [facility] [place], [place], PA-C   4 months ago [place], [place], PA-C   4 months ago [place], [place], PA-C      Future Appointments            In 5 months Simmons-Robinson, Riki Sheer, MD [employer], [place]

## 2021-10-24 ENCOUNTER — Ambulatory Visit: Admission: RE | Admit: 2021-10-24 | Payer: Commercial Managed Care - PPO | Source: Ambulatory Visit

## 2021-10-24 ENCOUNTER — Ambulatory Visit
Admission: RE | Admit: 2021-10-24 | Discharge: 2021-10-24 | Disposition: A | Discharge status: Home / Self Care | Payer: Commercial Managed Care - PPO | Source: Ambulatory Visit | Attending: Family Medicine | Admitting: Family Medicine

## 2021-10-24 DIAGNOSIS — R928 Other abnormal and inconclusive findings on diagnostic imaging of breast: Secondary | ICD-10-CM | POA: Diagnosis present

## 2021-11-03 ENCOUNTER — Ambulatory Visit
Admission: RE | Admit: 2021-11-03 | Discharge: 2021-11-03 | Disposition: A | Discharge status: Home / Self Care | Payer: Commercial Managed Care - PPO | Source: Ambulatory Visit | Attending: Family Medicine | Admitting: Family Medicine

## 2021-11-03 DIAGNOSIS — Z780 Asymptomatic menopausal state: Secondary | ICD-10-CM | POA: Diagnosis present

## 2021-11-03 DIAGNOSIS — Z0000 Encounter for general adult medical examination without abnormal findings: Secondary | ICD-10-CM | POA: Diagnosis present

## 2021-11-03 DIAGNOSIS — Z13820 Encounter for screening for osteoporosis: Secondary | ICD-10-CM | POA: Diagnosis present

## 2022-03-30 ENCOUNTER — Other Ambulatory Visit: Payer: Self-pay | Admitting: Family Medicine

## 2022-03-30 DIAGNOSIS — Z1231 Encounter for screening mammogram for malignant neoplasm of breast: Secondary | ICD-10-CM

## 2022-04-04 ENCOUNTER — Ambulatory Visit: Payer: Commercial Managed Care - PPO | Admitting: Family Medicine

## 2022-04-06 ENCOUNTER — Ambulatory Visit: Payer: Commercial Managed Care - PPO | Admitting: Family Medicine

## 2022-04-20 ENCOUNTER — Ambulatory Visit
Admission: RE | Admit: 2022-04-20 | Discharge: 2022-04-20 | Disposition: A | Discharge status: Home / Self Care | Payer: Commercial Managed Care - PPO | Source: Ambulatory Visit | Attending: Family Medicine | Admitting: Family Medicine

## 2022-04-20 DIAGNOSIS — Z1231 Encounter for screening mammogram for malignant neoplasm of breast: Secondary | ICD-10-CM | POA: Insufficient documentation

## 2022-04-25 ENCOUNTER — Encounter: Payer: Self-pay | Admitting: Family Medicine

## 2022-04-25 ENCOUNTER — Ambulatory Visit: Payer: Commercial Managed Care - PPO | Admitting: Family Medicine

## 2022-04-25 VITALS — BP 129/77 | HR 67 | Temp 98.2°F | Resp 16 | Ht 63.0 in | Wt 206.2 lb

## 2022-04-25 DIAGNOSIS — F419 Anxiety disorder, unspecified: Secondary | ICD-10-CM | POA: Diagnosis not present

## 2022-04-25 DIAGNOSIS — I10 Essential (primary) hypertension: Secondary | ICD-10-CM | POA: Diagnosis not present

## 2022-04-25 DIAGNOSIS — E039 Hypothyroidism, unspecified: Secondary | ICD-10-CM | POA: Diagnosis not present

## 2022-04-25 DIAGNOSIS — R221 Localized swelling, mass and lump, neck: Secondary | ICD-10-CM

## 2022-04-25 DIAGNOSIS — E7800 Pure hypercholesterolemia, unspecified: Secondary | ICD-10-CM | POA: Diagnosis not present

## 2022-04-25 MED ORDER — ALPRAZOLAM 0.25 MG PO TABS: 0.2500 mg | ORAL_TABLET | Freq: Every evening | ORAL | 0 refills | Status: AC | PRN

## 2022-04-25 NOTE — Assessment & Plan Note (Signed)
Controlled BP at goal Continue current medications at current doses No medications changes today  CMP collected today  

## 2022-04-25 NOTE — Patient Instructions (Addendum)
We will follow up with results of labs once they are available.   Please continue your current medications as prescribed  I will see you around 9.28.24 for your welcome to medicare exam.   Please make sure to get your updated eye exam and consider the Shingrix vaccine and covid booster vaccine.

## 2022-04-25 NOTE — Progress Notes (Signed)
I,[name],acting as a scribe for [employer], MD.,have documented all relevant documentation on the behalf of Ronnald Ramp, MD,as directed by  Ronnald Ramp, MD while in the presence of Ronnald Ramp, MD.   Established patient visit   Patient: Danielle Davenport   DOB: 1955/07/01   67 y.o. Female  MRN: [id number] Visit Date: 04/25/2022  Today's healthcare provider: Ronnald Ramp, MD   Chief Complaint  Patient presents with   Follow-up   Subjective    HPI   Hypertension, follow-up  BP Readings from Last 3 Encounters:  04/25/22 129/77  10/05/21 132/83  07/26/21 131/70   Wt Readings from Last 3 Encounters:  04/25/22 206 lb 3.2 oz (93.5 kg)  10/05/21 212 lb (96.2 kg)  07/26/21 211 lb (95.7 kg)     She was last seen for hypertension 7 months ago.  BP at that visit was 132/83. Management since that visit includes continue amlodipine  daily.  She reports excellent compliance with treatment. She is not having side effects.   Outside blood pressures are 135/80.  Symptoms: No chest pain No chest pressure  No palpitations No syncope  No dyspnea No orthopnea  No paroxysmal nocturnal dyspnea Yes lower extremity edema   Pertinent labs Lab Results  Component Value Date   CHOL 204 (H) 10/05/2021   HDL 55 10/05/2021   LDLCALC 129 (H) 10/05/2021   TRIG 111 10/05/2021   CHOLHDL 3.7 10/05/2021   Lab Results  Component Value Date   NA 144 10/05/2021   K 4.4 10/05/2021   CREATININE 0.81 10/05/2021   EGFR 80 10/05/2021   GLUCOSE 91 10/05/2021   TSH 1.800 10/05/2021     The 10-year ASCVD risk score (Arnett DK, et al., 2019) is: 8.5%  --------------------------------------------------------------------------------------------------- Anxiety, Follow-up  Patient takes Xanax 0.25mg  as needed for anxiety at bedtime   She reports good compliance with treatment, states that she usually takes this  medication twice a month. She reports excellent tolerance of treatment. She is not having side effects.   She feels her anxiety is mild and Unchanged since last visit.  Symptoms: No chest pain No difficulty concentrating  No dizziness Yes fatigue  No feelings of losing control No insomnia  No irritable No palpitations  No panic attacks No racing thoughts  No shortness of breath No sweating  No tremors/shakes    GAD-7 Results    04/25/2022    8:23 AM  GAD-7 Generalized Anxiety Disorder Screening Tool  1. Feeling Nervous, Anxious, or on Edge 1  2. Not Being Able to Stop or Control Worrying 0  3. Worrying Too Much About Different Things 0  4. Trouble Relaxing 1  5. Being So Restless it's Hard To Sit Still 0  6. Becoming Easily Annoyed or Irritable 0  7. Feeling Afraid As If Something Awful Might Happen 1  Total GAD-7 Score 3  Difficulty At Work, Home, or Getting  Along With Others? 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  Patient reports that she will be retiring in the next 4 weeks and is looking forward to spending more time taking care of herself and focusing her efforts on weight loss  She adds that her daughter and husband are both having  health challenges so she is concerned about taking care of them as well  Last LDL cholesterol was elevated Last A1c was in pre-diabetes range at 5.7    Clavicular/Neck swelling  Patient reports she has had swelling in this area for over 20 years and has been counseled that it is likely excess fatty tissue She states that it has been associated with shooting pains intermittently in the past few weeks  She would like to hold on getting imaging of the area for now but plans to  contact the office if she begins to have issues again   Medications: Outpatient Medications Prior to Visit  Medication Sig   acetaminophen (TYLENOL) 325 MG tablet Take by mouth.   amLODipine (NORVASC) 5 MG tablet TAKE 1 TABLET BY MOUTH ONCE A DAY   aspirin 81 MG chewable tablet Chew by mouth daily.   ibuprofen (ADVIL,MOTRIN) 800 MG tablet Take by mouth.   levothyroxine (SYNTHROID) 75 MCG tablet TAKE 1 TABLET BY MOUTH ONCE A DAY   Multiple Vitamins-Minerals (CENTRUM SILVER) CHEW Chew by mouth.   Multiple Vitamins-Minerals (ZINC PO) Take 1 tablet by mouth.   Omega-3 Fatty Acids (FISH OIL) 1000 MG CAPS Take by mouth.   vitamin E 180 MG (400 UNITS) capsule Take 400 Units by mouth daily.   [DISCONTINUED] ALPRAZolam (XANAX) 0.25 MG tablet Take 1 tablet (0.25 mg total) by mouth at bedtime as needed for anxiety.   No facility-administered medications prior to visit.    Review of Systems     Objective    BP 129/77 (BP Location: Left Arm, Patient Position: Sitting, Cuff Size: Large)   Pulse 67   Temp 98.2 F (36.8 C) (Oral)   Resp 16   Ht 5\' 3"  (1.6 m)   Wt 206 lb 3.2 oz (93.5 kg)   BMI 36.53 kg/m    Physical Exam Vitals reviewed.  Constitutional:      General: She is not in acute distress.    Appearance: Normal appearance. She is not ill-appearing, toxic-appearing or diaphoretic.  Eyes:     Conjunctiva/sclera: Conjunctivae normal.  Neck:     Thyroid: No thyroid mass, thyromegaly or thyroid tenderness.   Cardiovascular:     Rate and Rhythm: Normal rate and regular rhythm.     Pulses: Normal pulses.     Heart sounds: Normal heart sounds. No murmur heard.    No friction rub. No gallop.  Pulmonary:     Effort: Pulmonary effort is normal. No respiratory distress.     Breath sounds: Normal breath sounds. No stridor. No wheezing, rhonchi or rales.  Abdominal:     General: Bowel sounds are normal. There is no distension.     Palpations: Abdomen is soft.     Tenderness: There is  no abdominal tenderness.  Musculoskeletal:     Cervical back: Normal range of motion and neck supple. No muscular tenderness. Normal range of motion.     Right lower leg: No edema.     Left lower leg: No edema.  Lymphadenopathy:     Cervical: No cervical adenopathy.  Skin:    Findings: No erythema or rash.  Neurological:     Mental Status: She is alert and oriented to person, place, and time.       No results found for any visits on 04/25/22.  Assessment & Plan     Problem List Items Addressed This Visit       Cardiovascular and Mediastinum   Hypertension - Primary  Controlled BP at goal Continue current medications at current doses No medications changes today  CMP collected today       Relevant Orders   Comprehensive metabolic panel   TSH + free T4     Endocrine   Hypothyroidism    Chronic problem  Stable  Continue current medications at current doses   No medication changes today  Will collect TSH and free T4 today          Relevant Orders   TSH + free T4     Other   Anxiety    Chronic  Symptoms stable for majority of the time  Rarely needs to use Xanax 0.25mg  tabs for anxiety  Patient requesting refills today, PDMP reviewed and refills provided for 20tabs       Relevant Medications   ALPRAZolam (XANAX) 0.25 MG tablet   Other Relevant Orders   Comprehensive metabolic panel   TSH + free T4   Pure hypercholesterolemia    No current statin therapy  Last LDL was elevated at 129  Will recheck fasting lipid panel today  The 10-year ASCVD risk score (Arnett DK, et al., 2019) is: 8.5%         Relevant Orders   Lipid panel   Neck swelling    Chronic  Intermittently painful  Recommended that patient inform the office if she has increased swelling, increased pain, numbness or paresthesias in the left arm or any lightheadedness/dizziness  Patient offered US imaging of the are, she declines and would like to monitor for any changes in symptom  frequency or severity, plans to contact the office if symptoms change         Return in about 5 months (around 10/07/2022) for CPE.        The entirety of the information documented in the History of Present Illness, Review of Systems and Physical Exam were personally obtained by me. Portions of this information were initially documented by Hetty Ely, CMA . I, Ronnald Ramp, MD have reviewed the documentation above for thoroughness and accuracy.      Ronnald Ramp, MD  [facility] [phone] (phone) [phone] (fax)  [facility]

## 2022-04-25 NOTE — Assessment & Plan Note (Signed)
Chronic  Symptoms stable for majority of the time  Rarely needs to use Xanax 0.25mg  tabs for anxiety  Patient requesting refills today, PDMP reviewed and refills provided for 20tabs

## 2022-04-25 NOTE — Assessment & Plan Note (Signed)
Chronic  Intermittently painful  Recommended that patient inform the office if she has increased swelling, increased pain, numbness or paresthesias in the left arm or any lightheadedness/dizziness  Patient offered US imaging of the are, she declines and would like to monitor for any changes in symptom frequency or severity, plans to contact the office if symptoms change

## 2022-04-25 NOTE — Assessment & Plan Note (Signed)
No current statin therapy  Last LDL was elevated at 129  Will recheck fasting lipid panel today  The 10-year ASCVD risk score (Arnett DK, et al., 2019) is: 8.5%

## 2022-04-25 NOTE — Assessment & Plan Note (Signed)
Chronic problem  Stable  Continue current medications at current doses   No medication changes today  Will collect TSH and free T4 today

## 2022-04-26 LAB — COMPREHENSIVE METABOLIC PANEL
ALT: 18 IU/L (ref 0–32)
AST: 15 IU/L (ref 0–40)
Albumin/Globulin Ratio: 2.1 (ref 1.2–2.2)
Albumin: 4.6 g/dL (ref 3.9–4.9)
Alkaline Phosphatase: 127 IU/L — ABNORMAL HIGH (ref 44–121)
BUN/Creatinine Ratio: 16 (ref 12–28)
BUN: 13 mg/dL (ref 8–27)
Bilirubin Total: 0.4 mg/dL (ref 0.0–1.2)
CO2: 22 mmol/L (ref 20–29)
Calcium: 9.2 mg/dL (ref 8.7–10.3)
Chloride: 105 mmol/L (ref 96–106)
Creatinine, Ser: 0.8 mg/dL (ref 0.57–1.00)
Globulin, Total: 2.2 g/dL (ref 1.5–4.5)
Glucose: 100 mg/dL — ABNORMAL HIGH (ref 70–99)
Potassium: 4.3 mmol/L (ref 3.5–5.2)
Sodium: 141 mmol/L (ref 134–144)
Total Protein: 6.8 g/dL (ref 6.0–8.5)
eGFR: 81 mL/min/{1.73_m2} (ref >59)

## 2022-04-26 LAB — LIPID PANEL
Chol/HDL Ratio: 3.5 ratio (ref 0.0–4.4)
Cholesterol, Total: 202 mg/dL — ABNORMAL HIGH (ref 100–199)
HDL: 58 mg/dL (ref >39)
LDL Chol Calc (NIH): 124 mg/dL — ABNORMAL HIGH (ref 0–99)
Triglycerides: 114 mg/dL (ref 0–149)
VLDL Cholesterol Cal: 20 mg/dL (ref 5–40)

## 2022-04-26 LAB — TSH+FREE T4
Free T4: 1.3 ng/dL (ref 0.82–1.77)
TSH: 1.68 u[IU]/mL (ref 0.450–4.500)

## 2022-04-26 MED ORDER — ROSUVASTATIN CALCIUM 10 MG PO TABS
10.0000 mg | ORAL_TABLET | Freq: Every day | ORAL | 3 refills | Status: DC
Start: 2022-04-26 — End: 2023-04-17

## 2022-04-26 NOTE — Addendum Note (Signed)
Addended by: Bing Neighbors on: 04/26/2022 05:07 PM   Modules accepted: Orders

## 2022-05-23 ENCOUNTER — Other Ambulatory Visit: Payer: Self-pay | Admitting: Family Medicine

## 2022-05-23 MED ORDER — LEVOTHYROXINE SODIUM 75 MCG PO TABS: 75.0000 ug | ORAL_TABLET | Freq: Every day | ORAL | 3 refills | Status: DC

## 2022-05-29 ENCOUNTER — Other Ambulatory Visit: Payer: Self-pay | Admitting: Family Medicine

## 2022-05-29 DIAGNOSIS — I10 Essential (primary) hypertension: Secondary | ICD-10-CM

## 2022-09-04 ENCOUNTER — Other Ambulatory Visit: Payer: Self-pay | Admitting: Family Medicine

## 2022-09-04 DIAGNOSIS — I10 Essential (primary) hypertension: Secondary | ICD-10-CM

## 2022-12-14 ENCOUNTER — Ambulatory Visit (INDEPENDENT_AMBULATORY_CARE_PROVIDER_SITE_OTHER): Payer: HMO | Admitting: Family Medicine

## 2022-12-14 ENCOUNTER — Encounter: Payer: Self-pay | Admitting: Family Medicine

## 2022-12-14 VITALS — BP 132/74 | HR 84 | Resp 16 | Ht 63.0 in | Wt 216.0 lb

## 2022-12-14 DIAGNOSIS — F419 Anxiety disorder, unspecified: Secondary | ICD-10-CM | POA: Diagnosis not present

## 2022-12-14 DIAGNOSIS — Z0000 Encounter for general adult medical examination without abnormal findings: Secondary | ICD-10-CM

## 2022-12-14 DIAGNOSIS — E7800 Pure hypercholesterolemia, unspecified: Secondary | ICD-10-CM

## 2022-12-14 DIAGNOSIS — I739 Peripheral vascular disease, unspecified: Secondary | ICD-10-CM

## 2022-12-14 DIAGNOSIS — Z131 Encounter for screening for diabetes mellitus: Secondary | ICD-10-CM

## 2022-12-14 DIAGNOSIS — I10 Essential (primary) hypertension: Secondary | ICD-10-CM | POA: Diagnosis not present

## 2022-12-14 DIAGNOSIS — E039 Hypothyroidism, unspecified: Secondary | ICD-10-CM

## 2022-12-14 DIAGNOSIS — I890 Lymphedema, not elsewhere classified: Secondary | ICD-10-CM

## 2022-12-14 DIAGNOSIS — Z23 Encounter for immunization: Secondary | ICD-10-CM | POA: Diagnosis not present

## 2022-12-14 DIAGNOSIS — Z6836 Body mass index (BMI) 36.0-36.9, adult: Secondary | ICD-10-CM | POA: Diagnosis not present

## 2022-12-14 DIAGNOSIS — I872 Venous insufficiency (chronic) (peripheral): Secondary | ICD-10-CM

## 2022-12-14 NOTE — Progress Notes (Signed)
Complete physical exam   Patient: Danielle Davenport   DOB: 24-Mar-1955   67 y.o. Female  MRN: [id number] Visit Date: 12/14/2022  Today's healthcare provider: Ronnald Ramp, MD   Chief Complaint  Patient presents with   Annual Exam   Subjective    Danielle Davenport is a 67 y.o. female who presents today for a complete physical exam.   She reports consuming a general diet.   The patient does not participate in regular exercise at present.   She generally feels well.   She reports sleeping well.    She does not have additional problems to discuss today.   Discussed the use of AI scribe software for clinical note transcription with the patient, who gave verbal consent to proceed.  History of Present Illness   The patient is a 67 year old female who presented for an annual physical exam. She reported feeling generally healthy and has been maintaining an active lifestyle since retirement, including yard work. However, she expressed dissatisfaction with her current diet and a desire to be more active, even considering part-time work to stay busy.  The patient has been experiencing intermittent abdominal pain, which she suspects might be related to her liver or gallbladder, although no definitive diagnosis has been made. The pain comes and goes and does not appear to be associated with any specific triggers.  The patient also reported having vision problems despite having undergone cataract surgery. She has been evaluated for diabetic eye disease, but the results were negative. The patient's last A1c was 5.7, which is not indicative of diabetes.  The patient has not yet had an annual wellness visit but is open to scheduling one in the future. She is considering having this visit in March or April of the following year.         Past Medical History:  Diagnosis Date   Arthritis    BRCA negative    Colon polyp    Complication of anesthesia    slow to wake    Depression    Diverticulosis    Fatty liver    Functional disorder of stomach    Gastroduodenal ulcer    GERD (gastroesophageal reflux disease)    Liver disorder    Thyroid disease    Hypothyroid   Past Surgical History:  Procedure Laterality Date   BREAST BIOPSY Left 01/26/2021   stereo bx-calcs/?COIL" clip-benign   BREAST EXCISIONAL BIOPSY Left 2000   benign   BREAST SURGERY     CATARACT EXTRACTION W/PHACO Left 03/08/2021   Procedure: CATARACT EXTRACTION PHACO AND INTRAOCULAR LENS PLACEMENT (IOC) LEFT 3.23 00:20.3;  Surgeon: Galen Manila, MD;  Location: [facility];  Service: Ophthalmology;  Laterality: Left;   CATARACT EXTRACTION W/PHACO Right 03/22/2021   Procedure: CATARACT EXTRACTION PHACO AND INTRAOCULAR LENS PLACEMENT (IOC) RIGHT;  Surgeon: Galen Manila, MD;  Location: [facility];  Service: Ophthalmology;  Laterality: Right;  2.94 0:25.2   COLONOSCOPY WITH PROPOFOL N/A 11/06/2019   Procedure: COLONOSCOPY WITH PROPOFOL;  Surgeon: Pasty Spillers, MD;  Location: [facility];  Service: Endoscopy;  Laterality: N/A;   HYSTEROSCOPY     INCONTINENCE SURGERY     TUBAL LIGATION     UTERINE FIBROID SURGERY     Social History   Socioeconomic History   Marital status: Married    Spouse name: Windy Fast   Number of children: 2   Years of education: HS   Highest education level: 12th grade  Occupational History  Occupation:       WORK    Employer: [employer]  Tobacco Use   Smoking status: Never   Smokeless tobacco: Never  Vaping Use   Vaping status: Never Used  Substance and Sexual Activity   Alcohol use: No    Alcohol/week: 0.0 standard drinks of alcohol   Drug use: No   Sexual activity: Yes    Birth control/protection: Post-menopausal  Other Topics Concern   Not on file  Social History Narrative   Not on file   Social Determinants of Health   Financial Resource Strain: Low Risk  (04/24/2022)   Overall Financial Resource  Strain (CARDIA)    Difficulty of Paying Living Expenses: Not very hard  Food Insecurity: No Food Insecurity (04/24/2022)   Hunger Vital Sign    Worried About Running Out of Food in the Last Year: Never true    Ran Out of Food in the Last Year: Never true  Transportation Needs: No Transportation Needs (04/24/2022)   PRAPARE - Administrator, Civil Service (Medical): No    Lack of Transportation (Non-Medical): No  Physical Activity: Sufficiently Active (04/24/2022)   Exercise Vital Sign    Days of Exercise per Week: 6 days    Minutes of Exercise per Session: 30 min  Stress: No Stress Concern Present (04/24/2022)   [employer] of Occupational Health - Occupational Stress Questionnaire    Feeling of Stress : Only a little  Social Connections: Moderately Isolated (04/24/2022)   Social Connection and Isolation Panel [NHANES]    Frequency of Communication with Friends and Family: More than three times a week    Frequency of Social Gatherings with Friends and Family: Twice a week    Attends Religious Services: Never    Database administrator or Organizations: No    Attends Engineer, structural: Not on file    Marital Status: Married  Catering manager Violence: Not on file   Family Status  Relation Name Status   Mother  Deceased   Father  Deceased   Brother  Alive   Daughter  Alive   Son  Alive   MGF  (Not Specified)   PGF  (Not Specified)   Sister  (Not Specified)   Mat Aunt  (Not Specified)   Nurse, mental health  (Not Specified)  No partnership data on file   Family History  Problem Relation Age of Onset   Breast cancer Mother 6   Brain cancer Mother    Cirrhosis Father    COPD Brother    COPD Daughter    Emphysema Maternal Grandfather    Stroke Paternal Grandfather    Brain cancer Sister    Breast cancer Maternal Aunt    Stroke Maternal Uncle    Allergies  Allergen Reactions   Epinephrine Other (See Comments)    Was added to "numbing" injection for  breast biopsy.  Caused "shakes" for a while afterwards.   Sertraline Hcl     uncontrollable bowels, grinding and clinching teeth.     Medications: Outpatient Medications Prior to Visit  Medication Sig   acetaminophen (TYLENOL) 325 MG tablet Take by mouth.   ALPRAZolam (XANAX) 0.25 MG tablet Take 1 tablet (0.25 mg total) by mouth at bedtime as needed for anxiety.   amLODipine (NORVASC) 5 MG tablet TAKE ONE TABLET BY MOUTH ONCE A DAY   aspirin 81 MG chewable tablet Chew by mouth daily.   ibuprofen (ADVIL,MOTRIN) 800 MG tablet Take by mouth.  levothyroxine (SYNTHROID) 75 MCG tablet Take 1 tablet (75 mcg total) by mouth daily.   Multiple Vitamins-Minerals (CENTRUM SILVER) CHEW Chew by mouth.   Multiple Vitamins-Minerals (ZINC PO) Take 1 tablet by mouth.   Omega-3 Fatty Acids (FISH OIL) 1000 MG CAPS Take by mouth.   rosuvastatin (CRESTOR) 10 MG tablet Take 1 tablet (10 mg total) by mouth daily.   vitamin E 180 MG (400 UNITS) capsule Take 400 Units by mouth daily.   No facility-administered medications prior to visit.    Review of Systems  Last CBC Lab Results  Component Value Date   WBC 10.8 05/19/2021   HGB 14.0 05/19/2021   HCT 40.7 05/19/2021   MCV 84 05/19/2021   MCH 29.0 05/19/2021   RDW 13.3 05/19/2021   PLT 279 05/19/2021   Last metabolic panel Lab Results  Component Value Date   GLUCOSE 100 (H) 04/25/2022   NA 141 04/25/2022   K 4.3 04/25/2022   CL 105 04/25/2022   CO2 22 04/25/2022   BUN 13 04/25/2022   CREATININE 0.80 04/25/2022   EGFR 81 04/25/2022   CALCIUM 9.2 04/25/2022   PROT 6.8 04/25/2022   ALBUMIN 4.6 04/25/2022   LABGLOB 2.2 04/25/2022   AGRATIO 2.1 04/25/2022   BILITOT 0.4 04/25/2022   ALKPHOS 127 (H) 04/25/2022   AST 15 04/25/2022   ALT 18 04/25/2022   Last lipids Lab Results  Component Value Date   CHOL 202 (H) 04/25/2022   HDL 58 04/25/2022   LDLCALC 124 (H) 04/25/2022   TRIG 114 04/25/2022   CHOLHDL 3.5 04/25/2022   Last hemoglobin  A1c Lab Results  Component Value Date   HGBA1C 5.7 (H) 10/05/2021   Last thyroid functions Lab Results  Component Value Date   TSH 1.680 04/25/2022   T4TOTAL 8.8 03/13/2018       Objective    BP 132/74   Pulse 84   Resp 16   Ht 5\' 3"  (1.6 m)   Wt 216 lb (98 kg)   SpO2 95%   BMI 38.26 kg/m  BP Readings from Last 3 Encounters:  12/14/22 132/74  04/25/22 129/77  10/05/21 132/83   Wt Readings from Last 3 Encounters:  12/14/22 216 lb (98 kg)  04/25/22 206 lb 3.2 oz (93.5 kg)  10/05/21 212 lb (96.2 kg)        Physical Exam Vitals reviewed.  Constitutional:      General: She is not in acute distress.    Appearance: Normal appearance. She is not ill-appearing, toxic-appearing or diaphoretic.  HENT:     Head: Normocephalic and atraumatic.     Right Ear: Tympanic membrane and external ear normal. There is no impacted cerumen.     Left Ear: Tympanic membrane and external ear normal. There is no impacted cerumen.     Nose: Nose normal.     Mouth/Throat:     Pharynx: Oropharynx is clear.  Eyes:     General: No scleral icterus.    Extraocular Movements: Extraocular movements intact.     Conjunctiva/sclera: Conjunctivae normal.     Pupils: Pupils are equal, round, and reactive to light.  Cardiovascular:     Rate and Rhythm: Normal rate and regular rhythm.     Pulses: Normal pulses.     Heart sounds: Normal heart sounds. No murmur heard.    No friction rub. No gallop.  Pulmonary:     Effort: Pulmonary effort is normal. No respiratory distress.     Breath sounds: Normal  breath sounds. No wheezing, rhonchi or rales.  Abdominal:     General: Bowel sounds are normal. There is no distension.     Palpations: Abdomen is soft. There is no mass.     Tenderness: There is no abdominal tenderness. There is no guarding.  Musculoskeletal:        General: No deformity.     Cervical back: Normal range of motion and neck supple. No rigidity.     Right lower leg: No edema.      Left lower leg: No edema.  Lymphadenopathy:     Cervical: No cervical adenopathy.  Skin:    General: Skin is warm.     Capillary Refill: Capillary refill takes less than 2 seconds.     Findings: No erythema or rash.  Neurological:     General: No focal deficit present.     Mental Status: She is alert and oriented to person, place, and time.     Motor: No weakness.     Gait: Gait normal.  Psychiatric:        Mood and Affect: Mood normal.        Behavior: Behavior normal.       Last depression screening scores    12/14/2022    3:21 PM 04/25/2022    8:21 AM 10/05/2021    8:21 AM  PHQ 2/9 Scores  PHQ - 2 Score 1 0 1  PHQ- 9 Score 3 2 2     Last fall risk screening    12/14/2022    3:21 PM  Fall Risk   Falls in the past year? 0  Number falls in past yr: 0  Injury with Fall? 0    Last Audit-C alcohol use screening    04/24/2022    8:44 AM  Alcohol Use Disorder Test (AUDIT)  1. How often do you have a drink containing alcohol? 0  3. How often do you have six or more drinks on one occasion? 0   A score of 3 or more in women, and 4 or more in men indicates increased risk for alcohol abuse, EXCEPT if all of the points are from question 1   No results found for any visits on 12/14/22.  Assessment & Plan    Routine Health Maintenance and Physical Exam  Immunization History  Administered Date(s) Administered   Moderna Sars-Covid-2 Vaccination 02/11/2019, 03/11/2019, 11/26/2019   PNEUMOCOCCAL CONJUGATE-20 10/04/2020   Tdap 12/09/2013    Health Maintenance  Topic Date Due   Medicare Annual Wellness (AWV)  Never done   Zoster Vaccines- Shingrix (1 of 2) Never done   INFLUENZA VACCINE  08/10/2022   COVID-19 Vaccine (4 - 2023-24 season) 12/30/2022 (Originally 09/10/2022)   DTaP/Tdap/Td (2 - Td or Tdap) 12/10/2023   MAMMOGRAM  04/19/2024   Colonoscopy  11/05/2029   Pneumonia Vaccine 21+ Years old  Completed   DEXA SCAN  Completed   Hepatitis C Screening  Completed   HPV  VACCINES  Aged Out   OPHTHALMOLOGY EXAM  Discontinued    Problem List Items Addressed This Visit       Cardiovascular and Mediastinum   PAD (peripheral artery disease) (HCC)   Relevant Orders   CBC   CMP14+EGFR   Hypertension   Relevant Orders   CMP14+EGFR   Chronic venous insufficiency     Endocrine   Hypothyroidism   Relevant Orders   TSH+T4F+T3Free     Other   Pure hypercholesterolemia   Relevant Orders   Lipid panel  Lymphedema   BMI 36.0-36.9,adult   Relevant Orders   Hemoglobin A1c   Anxiety   Annual physical exam - Primary    Routine annual physical exam for a 67 year old. No acute complaints. Discussed general health, exercise, diet, and sleep. 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 - Perform physical exam - Order lab tests: A1c, CBC, complete metabolic panel, thyroid levels, cholesterol      Other Visit Diagnoses     Screening for diabetes mellitus       Relevant Orders   Hemoglobin A1c           Pre-Diabetes Previous A1c was 5.7, indicating pre-diabetes. No diagnosis of diabetes. Patient has had cataract surgery and reports blurry vision, but diabetic retinopathy was ruled out. Discussed that an A1c of 6.5 or higher indicates diabetes. - Monitor A1c levels - Discuss lifestyle modifications to prevent progression to diabetes  General Health Maintenance Discussed the importance of vaccinations and preventive care. Patient is due for Shingrix and influenza vaccines. Discussed the benefits and potential side effects of the Shingrix vaccine, including flu-like symptoms. Explained that shingles can lead to severe complications such as postherpetic neuralgia and ocular complications. Patient agreed to receive the influenza vaccine today and will consider the Shingrix vaccine. - recommended influenza vaccine - Provide information on Shingrix vaccine - Discuss COVID-19 vaccine for the upcoming  season     Return in about 4 months (around 04/14/2023) for AWV.       Ronnald Ramp, MD  [facility] [phone] (phone) [phone] (fax)  [facility]

## 2022-12-14 NOTE — Patient Instructions (Addendum)
It was a pleasure to see you today!  Thank you for choosing [facility] for your primary care.   Today you were seen for your annual physical  Please review the attached information regarding helpful preventive health topics.   To keep you healthy, please keep in mind the following health maintenance items that you are due for:   1.Shingrix vaccine    Please make sure to schedule your next annual physical for one year from today.   Best Wishes,   Dr. Roxan Hockey

## 2022-12-15 DIAGNOSIS — E7800 Pure hypercholesterolemia, unspecified: Secondary | ICD-10-CM | POA: Diagnosis not present

## 2022-12-15 DIAGNOSIS — Z131 Encounter for screening for diabetes mellitus: Secondary | ICD-10-CM | POA: Diagnosis not present

## 2022-12-15 DIAGNOSIS — I739 Peripheral vascular disease, unspecified: Secondary | ICD-10-CM | POA: Diagnosis not present

## 2022-12-15 DIAGNOSIS — Z6836 Body mass index (BMI) 36.0-36.9, adult: Secondary | ICD-10-CM | POA: Diagnosis not present

## 2022-12-15 DIAGNOSIS — I10 Essential (primary) hypertension: Secondary | ICD-10-CM | POA: Diagnosis not present

## 2022-12-15 DIAGNOSIS — E039 Hypothyroidism, unspecified: Secondary | ICD-10-CM | POA: Diagnosis not present

## 2022-12-15 NOTE — Assessment & Plan Note (Signed)
Routine annual physical exam for a 67 year old. No acute complaints. Discussed general health, exercise, diet, and sleep. 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 - Perform physical exam - Order lab tests: A1c, CBC, complete metabolic panel, thyroid levels, cholesterol

## 2022-12-16 LAB — CMP14+EGFR
ALT: 21 [IU]/L (ref 0–32)
AST: 21 [IU]/L (ref 0–40)
Albumin: 4.6 g/dL (ref 3.9–4.9)
Alkaline Phosphatase: 125 [IU]/L — ABNORMAL HIGH (ref 44–121)
BUN/Creatinine Ratio: 18 (ref 12–28)
BUN: 14 mg/dL (ref 8–27)
Bilirubin Total: 0.4 mg/dL (ref 0.0–1.2)
CO2: 24 mmol/L (ref 20–29)
Calcium: 9.1 mg/dL (ref 8.7–10.3)
Chloride: 103 mmol/L (ref 96–106)
Creatinine, Ser: 0.77 mg/dL (ref 0.57–1.00)
Globulin, Total: 2.6 g/dL (ref 1.5–4.5)
Glucose: 96 mg/dL (ref 70–99)
Potassium: 4.3 mmol/L (ref 3.5–5.2)
Sodium: 142 mmol/L (ref 134–144)
Total Protein: 7.2 g/dL (ref 6.0–8.5)
eGFR: 84 mL/min/{1.73_m2} (ref >59)

## 2022-12-16 LAB — CBC
Hematocrit: 42.1 % (ref 34.0–46.6)
Hemoglobin: 13.7 g/dL (ref 11.1–15.9)
MCH: 28.9 pg (ref 26.6–33.0)
MCHC: 32.5 g/dL (ref 31.5–35.7)
MCV: 89 fL (ref 79–97)
Platelets: 284 10*3/uL (ref 150–450)
RBC: 4.74 x10E6/uL (ref 3.77–5.28)
RDW: 12.8 % (ref 11.7–15.4)
WBC: 9.3 10*3/uL (ref 3.4–10.8)

## 2022-12-16 LAB — LIPID PANEL
Chol/HDL Ratio: 2.2 {ratio} (ref 0.0–4.4)
Cholesterol, Total: 139 mg/dL (ref 100–199)
HDL: 62 mg/dL (ref >39)
LDL Chol Calc (NIH): 59 mg/dL (ref 0–99)
Triglycerides: 95 mg/dL (ref 0–149)
VLDL Cholesterol Cal: 18 mg/dL (ref 5–40)

## 2022-12-16 LAB — TSH+T4F+T3FREE
Free T4: 1.19 ng/dL (ref 0.82–1.77)
T3, Free: 2.9 pg/mL (ref 2.0–4.4)
TSH: 2.37 u[IU]/mL (ref 0.450–4.500)

## 2022-12-16 LAB — HEMOGLOBIN A1C
Est. average glucose Bld gHb Est-mCnc: 120 mg/dL
Hgb A1c MFr Bld: 5.8 % — ABNORMAL HIGH (ref 4.8–5.6)

## 2022-12-19 NOTE — Addendum Note (Signed)
Addended by: Shirley Muscat on: 12/19/2022 03:46 PM   Modules accepted: Orders

## 2023-01-14 IMAGING — CR DG KNEE COMPLETE 4+V*R*
1 series · 4 of 4 positions shown · non-contrast
Comparison: None available

CLINICAL DATA: Knee pain that began this morning.

EXAM:
RIGHT KNEE - COMPLETE 4+ VIEW

[Series 1: dg knee complete 4 views right · 0.14mm/px · 4 of 4 slices shown]
[im 1/4]
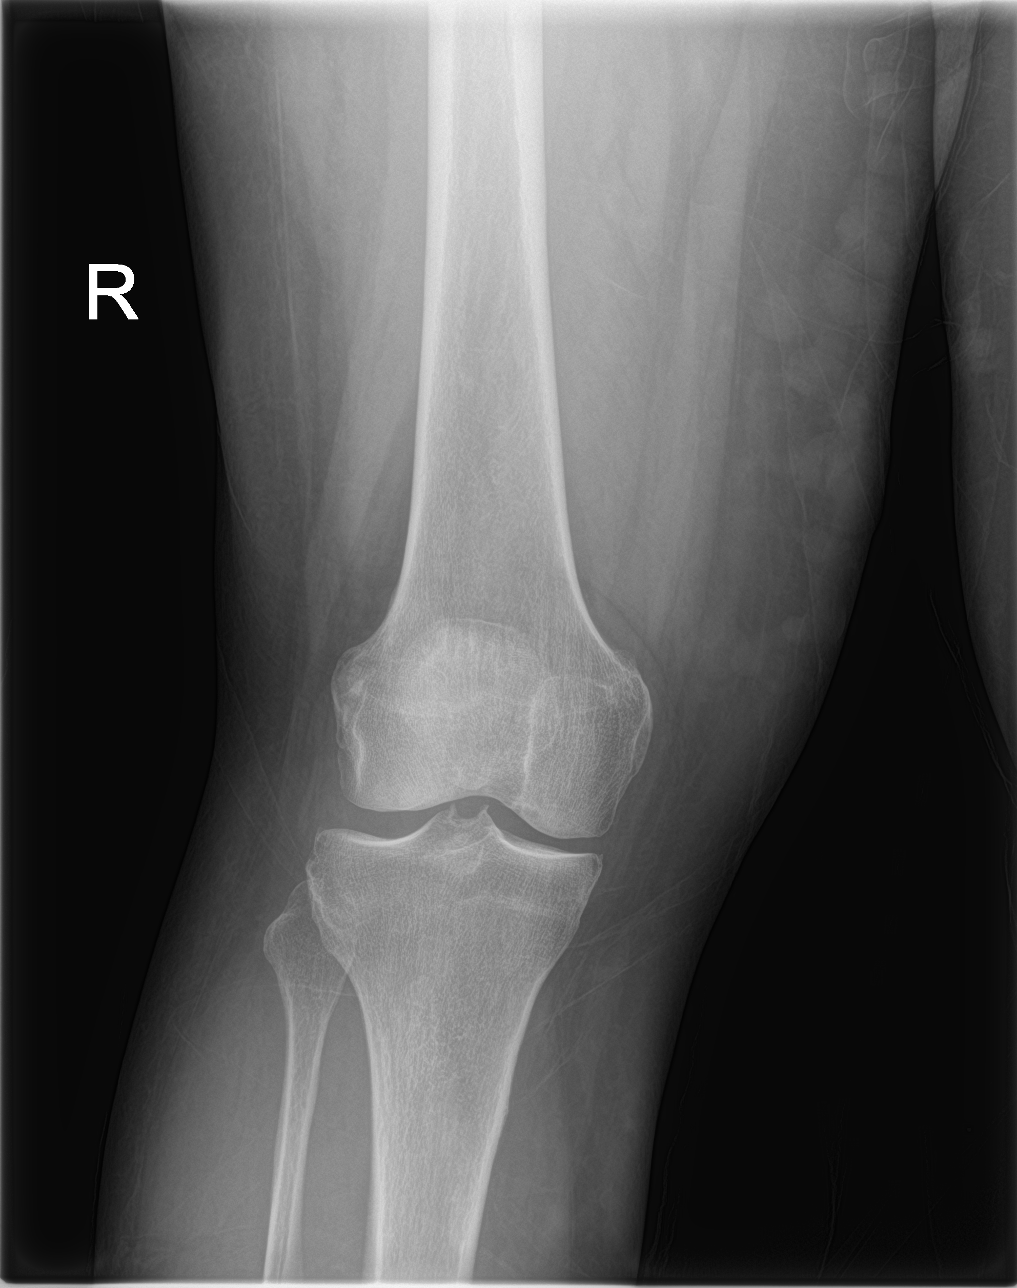
[im 2/4]
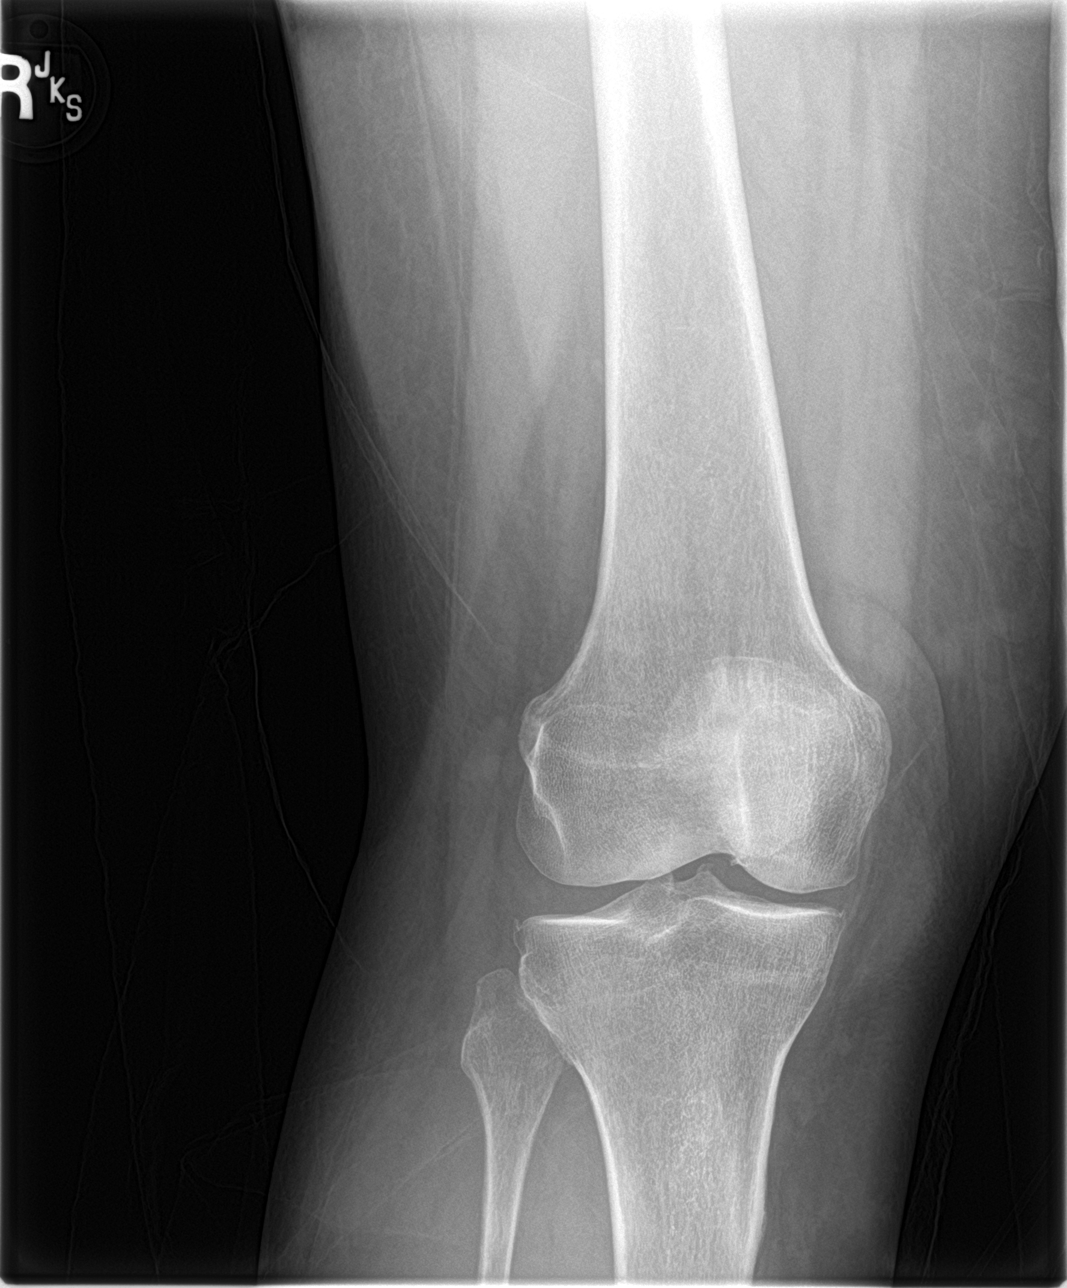
[im 3/4]
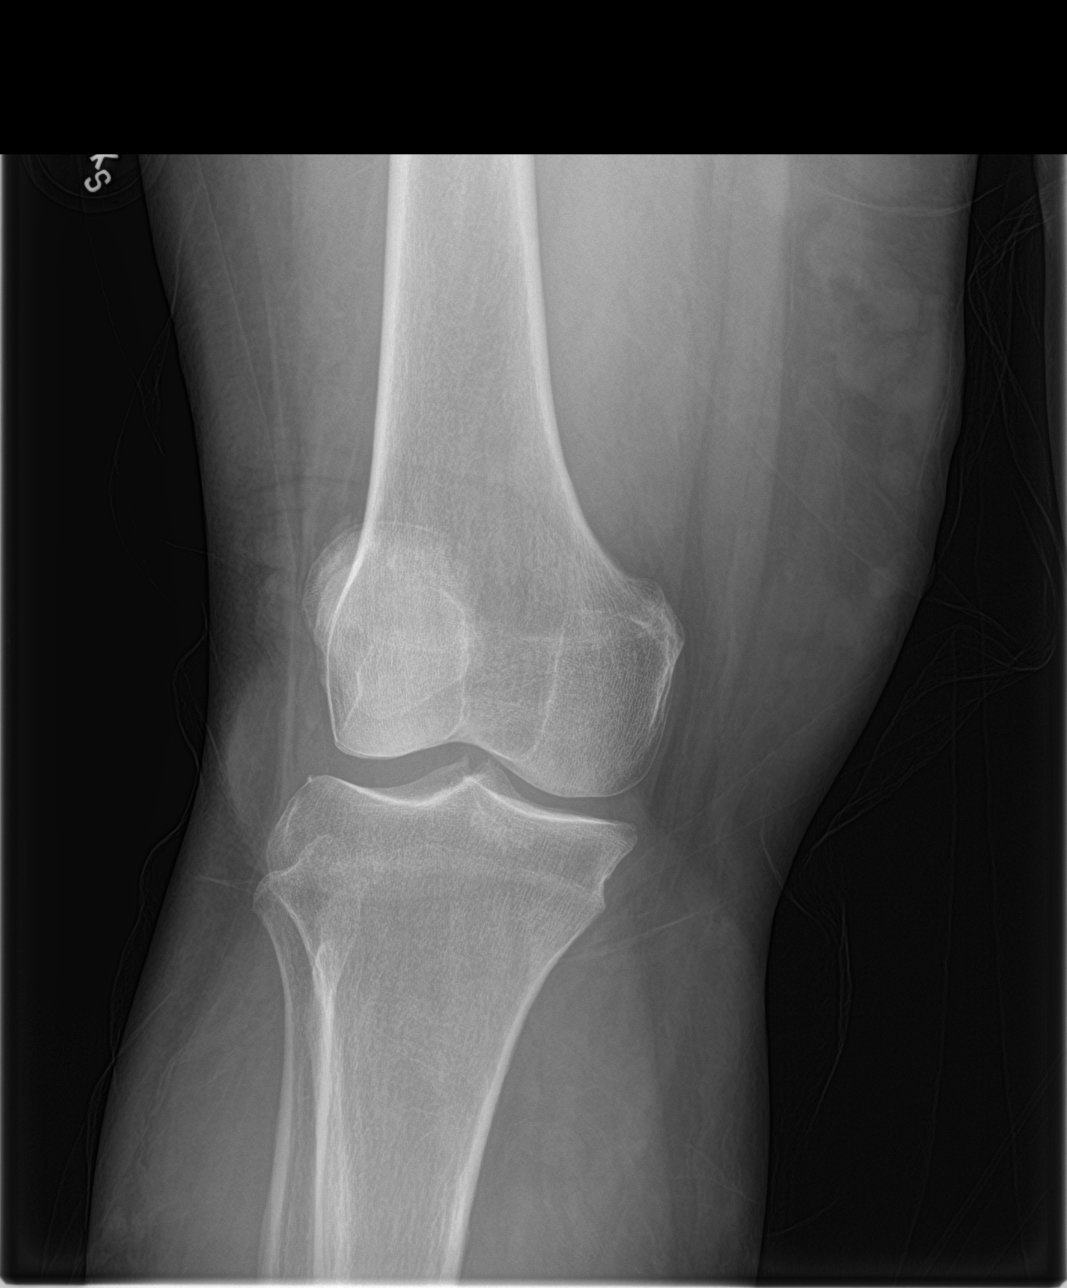
[im 4/4]
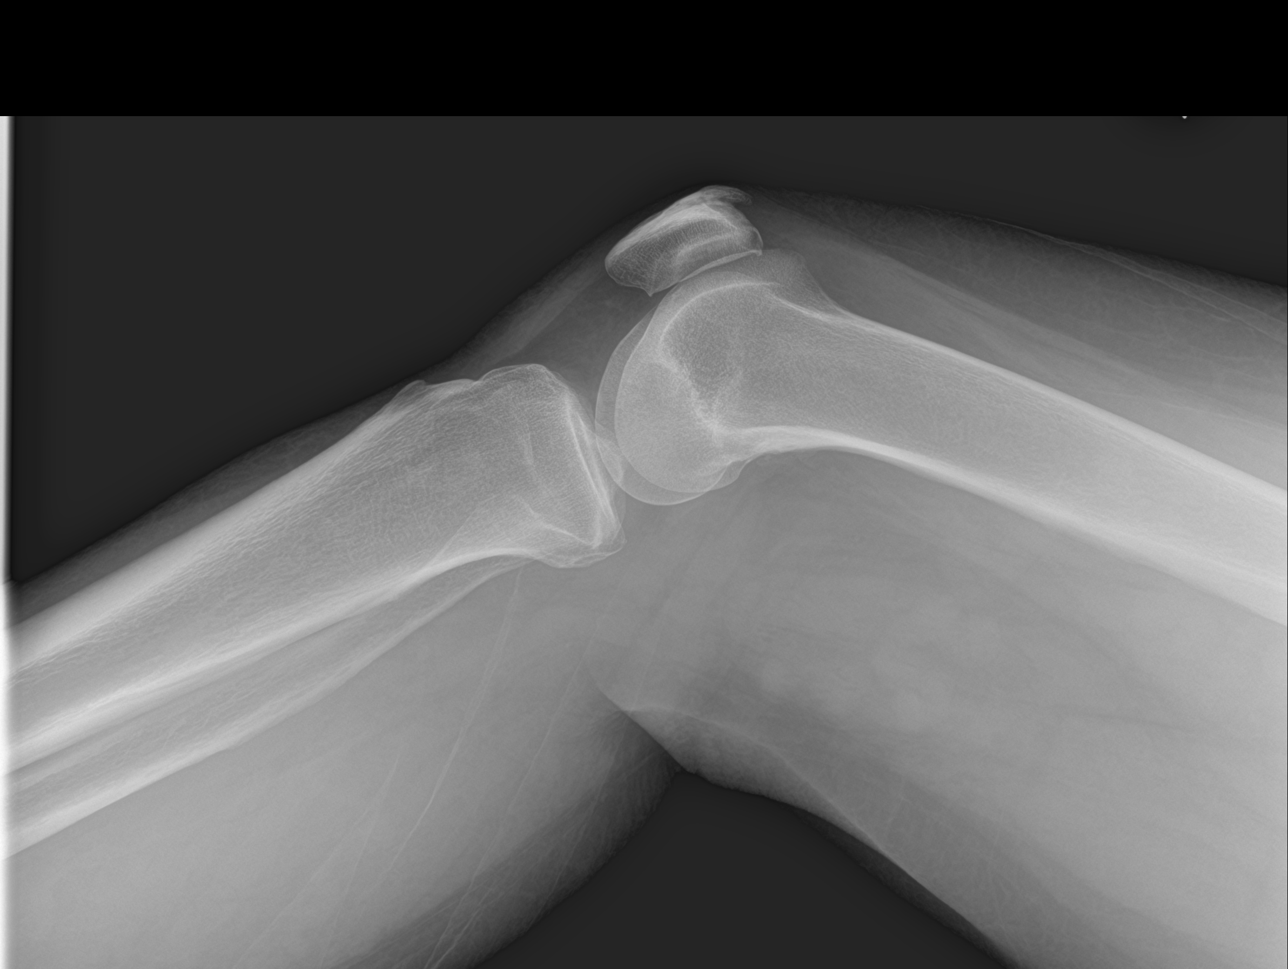

[4 of 4 positions shown; findings below may reference images not displayed]

FINDINGS: Vaguely serpiginous structures with added density in the
subcutaneous fat along the RIGHT inter thigh.

Tricompartmental osteoarthritic changes, mild in the medial and
lateral compartments and slightly more pronounced in the
patellofemoral aspect of the joint. No joint effusion. No sign of
fracture or dislocation.
IMPRESSION: 1. No joint effusion.
2. Tricompartmental osteoarthritic changes without acute bony
abnormality.
3. Vaguely serpiginous structures along the medial RIGHT thigh in
the subcutaneous fat, correlate with any signs of large varicose
veins or material overlying the patient in this region.

## 2023-04-17 ENCOUNTER — Ambulatory Visit (INDEPENDENT_AMBULATORY_CARE_PROVIDER_SITE_OTHER): Payer: Self-pay

## 2023-04-17 ENCOUNTER — Other Ambulatory Visit: Payer: Self-pay | Admitting: Family Medicine

## 2023-04-17 VITALS — BP 128/82 | Ht 63.0 in | Wt 219.4 lb

## 2023-04-17 DIAGNOSIS — E7800 Pure hypercholesterolemia, unspecified: Secondary | ICD-10-CM

## 2023-04-17 DIAGNOSIS — Z0000 Encounter for general adult medical examination without abnormal findings: Secondary | ICD-10-CM | POA: Diagnosis not present

## 2023-04-17 DIAGNOSIS — I10 Essential (primary) hypertension: Secondary | ICD-10-CM

## 2023-04-17 DIAGNOSIS — Z1231 Encounter for screening mammogram for malignant neoplasm of breast: Secondary | ICD-10-CM

## 2023-04-17 MED ORDER — LEVOTHYROXINE SODIUM 75 MCG PO TABS: 75.0000 ug | ORAL_TABLET | Freq: Every day | ORAL | 2 refills | Status: AC

## 2023-04-17 MED ORDER — ROSUVASTATIN CALCIUM 10 MG PO TABS: 10.0000 mg | ORAL_TABLET | Freq: Every day | ORAL | 2 refills | Status: AC

## 2023-04-17 MED ORDER — AMLODIPINE BESYLATE 5 MG PO TABS: 5.0000 mg | ORAL_TABLET | Freq: Every day | ORAL | 2 refills | Status: AC

## 2023-04-17 NOTE — Progress Notes (Signed)
 Subjective:   Danielle Davenport is a 68 y.o. who presents for a Medicare Wellness preventive visit.  Visit Complete: In person  Persons Participating in Visit: Patient.  AWV Questionnaire: No: Patient Medicare AWV questionnaire was not completed prior to this visit.  Cardiac Risk Factors include: advanced age (>79men, >39 women);obesity (BMI >30kg/m2);dyslipidemia;hypertension     Objective:    Today's Vitals   04/17/23 0810  BP: 128/82  Weight: 219 lb 6.4 oz (99.5 kg)  Height: 5\' 3"  (1.6 m)   Body mass index is 38.86 kg/m.     04/17/2023    8:23 AM 05/17/2021    2:00 PM 03/08/2021   10:26 AM 11/06/2019    7:09 AM 06/28/2016    8:58 AM 06/07/2016    2:25 PM 04/03/2016   10:57 AM  Advanced Directives  Does Patient Have a Medical Advance Directive? No No No No No No No  Would patient like information on creating a medical advance directive? No - Patient declined  No - Patient declined        Current Medications (verified) Outpatient Encounter Medications as of 04/17/2023  Medication Sig   acetaminophen (TYLENOL) 325 MG tablet Take by mouth.   ALPRAZolam (XANAX) 0.25 MG tablet Take 1 tablet (0.25 mg total) by mouth at bedtime as needed for anxiety.   amLODipine (NORVASC) 5 MG tablet TAKE ONE TABLET BY MOUTH ONCE A DAY   aspirin 81 MG chewable tablet Chew by mouth daily.   ibuprofen (ADVIL,MOTRIN) 800 MG tablet Take by mouth.   levothyroxine (SYNTHROID) 75 MCG tablet Take 1 tablet (75 mcg total) by mouth daily.   Multiple Vitamins-Minerals (CENTRUM SILVER) CHEW Chew by mouth.   Omega-3 Fatty Acids (FISH OIL) 1000 MG CAPS Take by mouth.   rosuvastatin (CRESTOR) 10 MG tablet Take 1 tablet (10 mg total) by mouth daily.   vitamin E 180 MG (400 UNITS) capsule Take 400 Units by mouth daily.   Zinc 100 MG TABS Take 100 mg by mouth daily.   [DISCONTINUED] Multiple Vitamins-Minerals (ZINC PO) Take 1 tablet by mouth.   No facility-administered encounter medications on file as of  04/17/2023.    Allergies (verified) Epinephrine and Sertraline hcl   History: Past Medical History:  Diagnosis Date   Arthritis    BRCA negative    Colon polyp    Complication of anesthesia    slow to wake   Depression    Diverticulosis    Fatty liver    Functional disorder of stomach    Gastroduodenal ulcer    GERD (gastroesophageal reflux disease)    Liver disorder    Thyroid disease    Hypothyroid   Past Surgical History:  Procedure Laterality Date   BREAST BIOPSY Left 01/26/2021   stereo bx-calcs/?COIL" clip-benign   BREAST EXCISIONAL BIOPSY Left 2000   benign   BREAST SURGERY     CATARACT EXTRACTION W/PHACO Left 03/08/2021   Procedure: CATARACT EXTRACTION PHACO AND INTRAOCULAR LENS PLACEMENT (IOC) LEFT 3.23 00:20.3;  Surgeon: Galen Manila, MD;  Location: [facility];  Service: Ophthalmology;  Laterality: Left;   CATARACT EXTRACTION W/PHACO Right 03/22/2021   Procedure: CATARACT EXTRACTION PHACO AND INTRAOCULAR LENS PLACEMENT (IOC) RIGHT;  Surgeon: Galen Manila, MD;  Location: [facility];  Service: Ophthalmology;  Laterality: Right;  2.94 0:25.2   COLONOSCOPY WITH PROPOFOL N/A 11/06/2019   Procedure: COLONOSCOPY WITH PROPOFOL;  Surgeon: Pasty Spillers, MD;  Location: [facility];  Service: Endoscopy;  Laterality: N/A;  HYSTEROSCOPY     INCONTINENCE SURGERY     TUBAL LIGATION     UTERINE FIBROID SURGERY     Family History  Problem Relation Age of Onset   Breast cancer Mother 58   Brain cancer Mother    Cirrhosis Father    COPD Brother    COPD Daughter    Emphysema Maternal Grandfather    Stroke Paternal Grandfather    Brain cancer Sister    Breast cancer Maternal Aunt    Stroke Maternal Uncle    Social History   Socioeconomic History   Marital status: Married    Spouse name: Windy Fast   Number of children: 2   Years of education: HS   Highest education level: 12th grade  Occupational History   Occupation:       WORK     Employer: [employer]  Tobacco Use   Smoking status: Never   Smokeless tobacco: Never  Vaping Use   Vaping status: Never Used  Substance and Sexual Activity   Alcohol use: No    Alcohol/week: 0.0 standard drinks of alcohol   Drug use: No   Sexual activity: Yes    Birth control/protection: Post-menopausal  Other Topics Concern   Not on file  Social History Narrative   Not on file   Social Drivers of Health   Financial Resource Strain: Low Risk  (04/17/2023)   Overall Financial Resource Strain (CARDIA)    Difficulty of Paying Living Expenses: Not very hard  Food Insecurity: No Food Insecurity (04/17/2023)   Hunger Vital Sign    Worried About Running Out of Food in the Last Year: Never true    Ran Out of Food in the Last Year: Never true  Transportation Needs: No Transportation Needs (04/17/2023)   PRAPARE - Administrator, Civil Service (Medical): No    Lack of Transportation (Non-Medical): No  Physical Activity: Insufficiently Active (04/17/2023)   Exercise Vital Sign    Days of Exercise per Week: 3 days    Minutes of Exercise per Session: 30 min  Stress: No Stress Concern Present (04/17/2023)   [employer] of Occupational Health - Occupational Stress Questionnaire    Feeling of Stress : Only a little  Social Connections: Moderately Isolated (04/17/2023)   Social Connection and Isolation Panel [NHANES]    Frequency of Communication with Friends and Family: More than three times a week    Frequency of Social Gatherings with Friends and Family: Twice a week    Attends Religious Services: Never    Database administrator or Organizations: No    Attends Engineer, structural: Never    Marital Status: Married    Tobacco Counseling Counseling given: Not Answered    Clinical Intake:  Pre-visit preparation completed: Yes  Pain : No/denies pain     BMI - recorded: 38.86 Nutritional Status: BMI > 30  Obese Nutritional Risks: None Diabetes:  No  Lab Results  Component Value Date   HGBA1C 5.8 (H) 12/15/2022   HGBA1C 5.7 (H) 10/05/2021     How often do you need to have someone help you when you read instructions, pamphlets, or other written materials from your doctor or pharmacy?: 1 - Never  Interpreter Needed?: No  Information entered by :: Kennedy Bucker, LPN   Activities of Daily Living     04/17/2023    8:24 AM  In your present state of health, do you have any difficulty performing the following activities:  Hearing? 0  Vision? 0  Difficulty concentrating or making decisions? 0  Walking or climbing stairs? 0  Dressing or bathing? 0  Doing errands, shopping? 0  Preparing Food and eating ? N  Using the Toilet? N  In the past six months, have you accidently leaked urine? N  Do you have problems with loss of bowel control? N  Managing your Medications? N  Managing your Finances? N  Housekeeping or managing your Housekeeping? N    Patient Care Team: Ronnald Ramp, MD as PCP - General (Family Medicine) Galen Manila, MD as Referring Physician (Ophthalmology)  Indicate any recent Medical Services you may have received from other than [facility] providers in the past year (date may be approximate).     Assessment:   This is a routine wellness examination for Ashlei.  Hearing/Vision screen Hearing Screening - Comments:: NO AIDS Vision Screening - Comments:: [facility]- DR.[email or web address]   Goals Addressed             This Visit's Progress    DIET - EAT MORE FRUITS AND VEGETABLES         Depression Screen     04/17/2023    8:19 AM 12/14/2022    3:21 PM 04/25/2022    8:21 AM 10/05/2021    8:21 AM 04/18/2021    1:24 PM 10/04/2020    9:07 AM 10/01/2019    8:26 AM  PHQ 2/9 Scores  PHQ - 2 Score 0 1 0 1 2 0 0  PHQ- 9 Score 1 3 2 2 3  2     Fall Risk     04/17/2023    8:24 AM 12/14/2022    3:21 PM 04/25/2022    8:20 AM 10/05/2021    8:21 AM 04/18/2021    1:24 PM  Fall Risk   Falls in the past year? 0 0 0  0 0  Number falls in past yr: 0 0 0 0 0  Injury with Fall? 0 0 0 0 0  Risk for fall due to : No Fall Risks  No Fall Risks No Fall Risks No Fall Risks  Follow up Falls prevention discussed;Falls evaluation completed    Falls evaluation completed    MEDICARE RISK AT HOME:  Medicare Risk at Home Any stairs in or around the home?: Yes If so, are there any without handrails?: No Home free of loose throw rugs in walkways, pet beds, electrical cords, etc?: Yes Adequate lighting in your home to reduce risk of falls?: Yes Life alert?: No Use of a cane, walker or w/c?: No Grab bars in the bathroom?: Yes Shower chair or bench in shower?: No Elevated toilet seat or a handicapped toilet?: No  TIMED UP AND GO:  Was the test performed?  Yes  Length of time to ambulate 10 feet: 4 sec Gait steady and fast without use of assistive device  Cognitive Function: 6CIT completed        04/17/2023    8:25 AM  6CIT Screen  What Year? 0 points  What month? 0 points  What time? 0 points  Count back from 20 0 points  Months in reverse 0 points  Repeat phrase 0 points  Total Score 0 points    Immunizations Immunization History  Administered Date(s) Administered   Fluad Trivalent(High Dose 65+) 12/14/2022   Moderna Sars-Covid-2 Vaccination 02/11/2019, 03/11/2019, 11/26/2019   PNEUMOCOCCAL CONJUGATE-20 10/04/2020   Tdap 12/09/2013    Screening Tests Health Maintenance  Topic Date Due   Zoster Vaccines-  Shingrix (1 of 2) Never done   COVID-19 Vaccine (4 - 2024-25 season) 09/10/2022   MAMMOGRAM  04/20/2023   INFLUENZA VACCINE  08/10/2023   DTaP/Tdap/Td (2 - Td or Tdap) 12/10/2023   Medicare Annual Wellness (AWV)  04/16/2024   DEXA SCAN  11/04/2026   Colonoscopy  11/05/2029   Pneumonia Vaccine 45+ Years old  Completed   Hepatitis C Screening  Completed   HPV VACCINES  Aged Out   OPHTHALMOLOGY EXAM  Discontinued    Health Maintenance  Health Maintenance Due  Topic Date Due   Zoster  Vaccines- Shingrix (1 of 2) Never done   COVID-19 Vaccine (4 - 2024-25 season) 09/10/2022   Health Maintenance Items Addressed: Mammogram ordered- UP TO DATE ON COLONOSCOPY, UP TO DATE ON BONE DENSITY NEEDS SHINGRIX  Additional Screening:  Vision Screening: Recommended annual ophthalmology exams for early detection of glaucoma and other disorders of the eye.  Dental Screening: Recommended annual dental exams for proper oral hygiene  Community Resource Referral / Chronic Care Management: CRR required this visit?  No   CCM required this visit?  No     Plan:     I have personally reviewed and noted the following in the patient's chart:   Medical and social history Use of alcohol, tobacco or illicit drugs  Current medications and supplements including opioid prescriptions. Patient is not currently taking opioid prescriptions. Functional ability and status Nutritional status Physical activity Advanced directives List of other physicians Hospitalizations, surgeries, and ER visits in previous 12 months Vitals Screenings to include cognitive, depression, and falls Referrals and appointments  In addition, I have reviewed and discussed with patient certain preventive protocols, quality metrics, and best practice recommendations. A written personalized care plan for preventive services as well as general preventive health recommendations were provided to patient.     Hal Hope, LPN   09/14/6211   After Visit Summary: (In Person-Declined) Patient declined AVS at this time.  Notes:  MAMMOGRAM ORDERED

## 2023-04-17 NOTE — Patient Instructions (Addendum)
 Danielle Davenport , Thank you for taking time to come for your Medicare Wellness Visit. I appreciate your ongoing commitment to your health goals. Please review the following plan we discussed and let me know if I can assist you in the future.   Referrals/Orders/Follow-Ups/Clinician Recommendations: MAMMOGRAM REFERRAL SENT  You have an order for:  []   2D Mammogram  [x]   3D Mammogram  []   Bone Density     Please call for appointment:  [facility]  [address] [place] [address] [phone] [facility] [address] [place], [place] [address] [phone] [facility] [address] Geanie Logan [place], [place] [address] [phone]   Make sure to wear two-piece clothing.  No lotions, powders, or deodorants the day of the appointment. Make sure to bring picture ID and insurance card.  Bring list of medications you are currently taking including any supplements.   Schedule your [place] screening mammogram through MyChart!   Log into your MyChart account.  Go to 'Visit' (or 'Appointments' if on mobile App) --> Schedule an Appointment  Under 'Select a Reason for Visit' choose the Mammogram Screening option.  Complete the pre-visit questions and select the time and place that best fits your schedule.   This is a list of the screening recommended for you and due dates:  Health Maintenance  Topic Date Due   Zoster (Shingles) Vaccine (1 of 2) Never done   COVID-19 Vaccine (4 - 2024-25 season) 09/10/2022   Mammogram  04/20/2023   Flu Shot  08/10/2023   DTaP/Tdap/Td vaccine (2 - Td or Tdap) 12/10/2023   Medicare Annual Wellness Visit  04/16/2024   DEXA scan (bone density measurement)  11/04/2026   Colon Cancer Screening  11/05/2029   Pneumonia Vaccine  Completed   Hepatitis C Screening  Completed   HPV Vaccine  Aged Out   Eye exam for diabetics   Discontinued    Advanced directives: (ACP Link)Information on Advanced Care Planning can be found at [facility] Advance Health Care Directives Advance Health Care Directives. [email or web address]   Next Medicare Annual Wellness Visit scheduled for next year: Yes  04/22/24 @ 8:10 am in person

## 2023-09-26 NOTE — Progress Notes (Signed)
 Pharmacy Quality Measure Review  This patient is appearing on a report for being at risk of failing the adherence measure for cholesterol (statin) medications this calendar year.   Medication: rosuvastatin  10 mg Last fill date: 04/17/23 for 90 day supply  Contacted pharmacy to facilitate refills.   Ayianna Darnold E. Marsh, PharmD Clinical Pharmacist [facility] [phone]

## 2023-10-16 ENCOUNTER — Encounter

## 2023-11-01 ENCOUNTER — Ambulatory Visit
Admission: RE | Admit: 2023-11-01 | Discharge: 2023-11-01 | Disposition: A | Discharge status: Home / Self Care | Source: Ambulatory Visit | Attending: Family Medicine | Admitting: Family Medicine

## 2023-11-01 DIAGNOSIS — Z1231 Encounter for screening mammogram for malignant neoplasm of breast: Secondary | ICD-10-CM | POA: Diagnosis not present

## 2023-11-06 ENCOUNTER — Ambulatory Visit: Payer: Self-pay | Admitting: Family Medicine

## 2023-12-25 ENCOUNTER — Encounter: Admitting: Family Medicine

## 2023-12-25 ENCOUNTER — Telehealth: Payer: Self-pay

## 2023-12-25 NOTE — Progress Notes (Signed)
 Pharmacy Quality Measure Review  This patient is appearing on a report for being at risk of failing the adherence measure for cholesterol (statin) medications this calendar year.   Medication: rosuvastatin  Last fill date: 09/26/23 for 90 day supply  Contacted pharmacy to facilitate refills.  anticoagulation e

## 2024-02-27 ENCOUNTER — Encounter: Admitting: Family Medicine

## 2024-04-22 ENCOUNTER — Ambulatory Visit: Payer: Self-pay
